# Patient Record
Sex: Female | Born: 1945 | Race: White | Hispanic: No | Marital: Single | Smoking: Former smoker
Health system: Southern US, Community
[De-identification: ages and names within clinical notes are randomized; demographics above are authoritative.]

## PROBLEM LIST (undated history)

## (undated) DIAGNOSIS — I639 Cerebral infarction, unspecified: Secondary | ICD-10-CM

## (undated) DIAGNOSIS — I10 Essential (primary) hypertension: Secondary | ICD-10-CM

## (undated) HISTORY — DX: Essential (primary) hypertension: I10

## (undated) HISTORY — DX: Cerebral infarction, unspecified: I63.9

---

## 2004-11-11 ENCOUNTER — Ambulatory Visit: Payer: Self-pay | Admitting: Internal Medicine

## 2006-02-22 ENCOUNTER — Ambulatory Visit: Payer: Self-pay | Admitting: Internal Medicine

## 2006-03-06 ENCOUNTER — Ambulatory Visit: Payer: Self-pay | Admitting: Internal Medicine

## 2006-08-27 ENCOUNTER — Ambulatory Visit: Payer: Self-pay | Admitting: Unknown Physician Specialty

## 2008-09-10 ENCOUNTER — Ambulatory Visit: Payer: Self-pay | Admitting: Family Medicine

## 2011-07-13 ENCOUNTER — Ambulatory Visit: Payer: Self-pay | Admitting: Internal Medicine

## 2011-07-25 ENCOUNTER — Ambulatory Visit: Payer: Self-pay | Admitting: Internal Medicine

## 2011-12-03 IMAGING — US ULTRASOUND RIGHT BREAST
1 series · 17 of 25 positions shown · non-contrast
Comparison: none

REASON FOR EXAM: AV RT SPICULATED FOCAL ASYMMETRY
COMMENTS:

PROCEDURE:     US  - US BREAST RIGHT  - July 25, 2011 [DATE]
RESULT:     TECHNIQUE: Digital diagnostic right mammograms were obtained.
FDA approved computer-aided detection (CAD) for mammography was utilized for
this study.

[Series 1: ultrasound right breast · 17 of 30 slices shown]
[im 1/30]
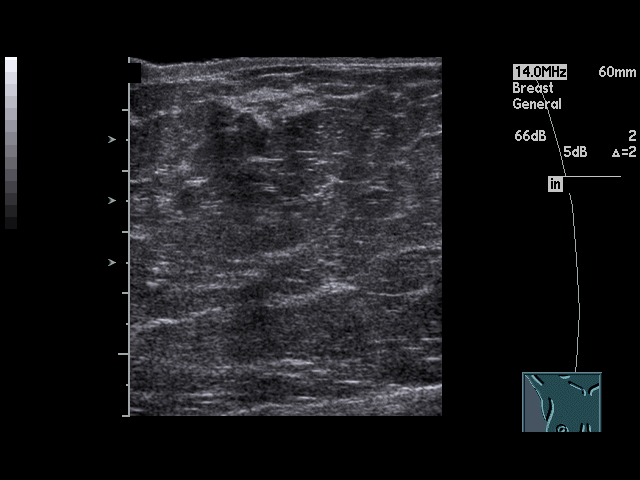
[im 3/30]
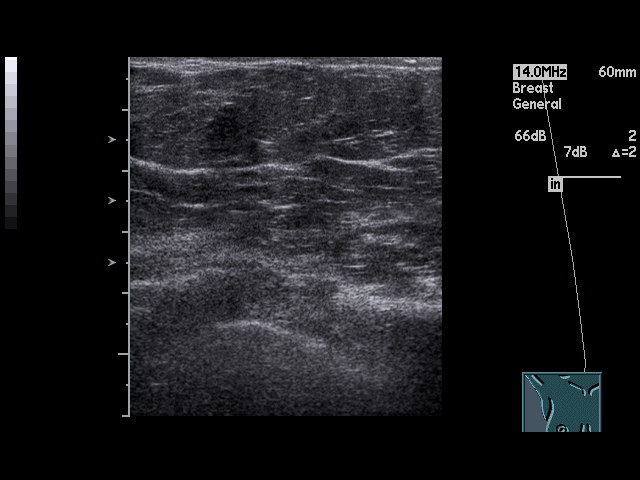
[im 4/30]
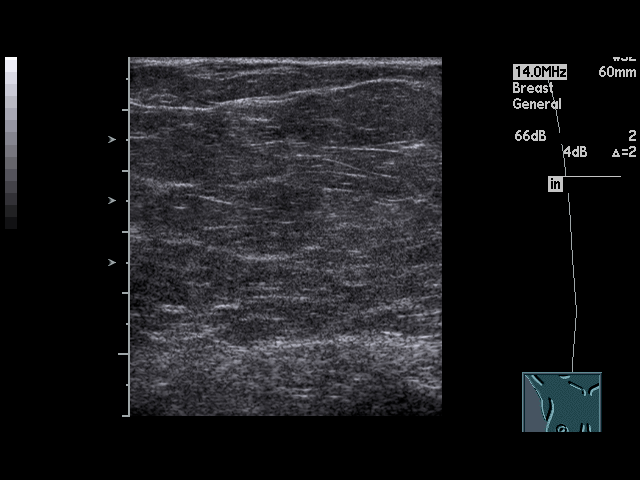
[im 7/30]
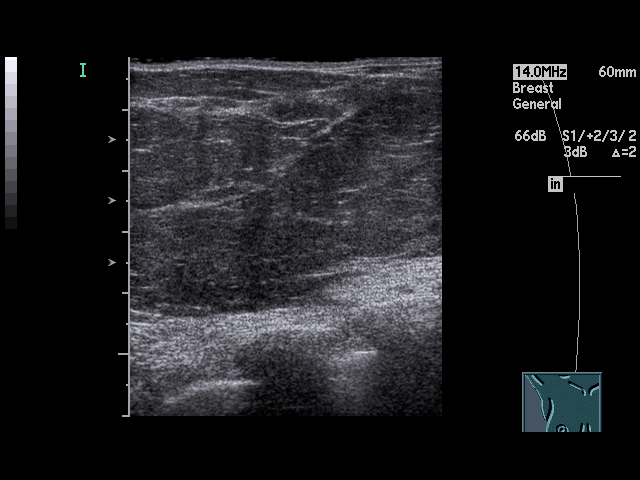
[im 8/30]
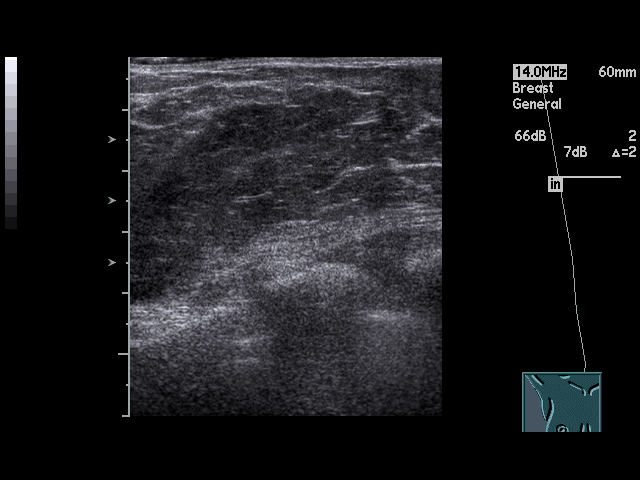
[im 10/30]
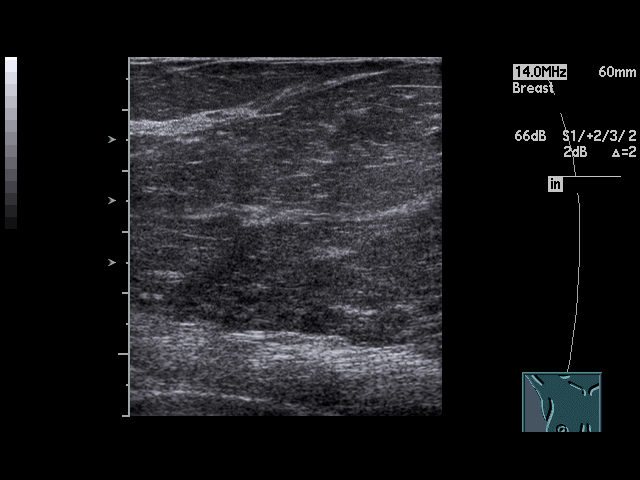
[im 11/30]
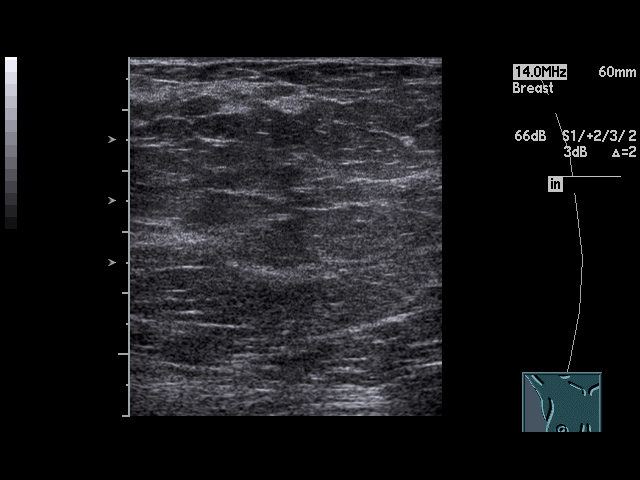
[im 14/30]
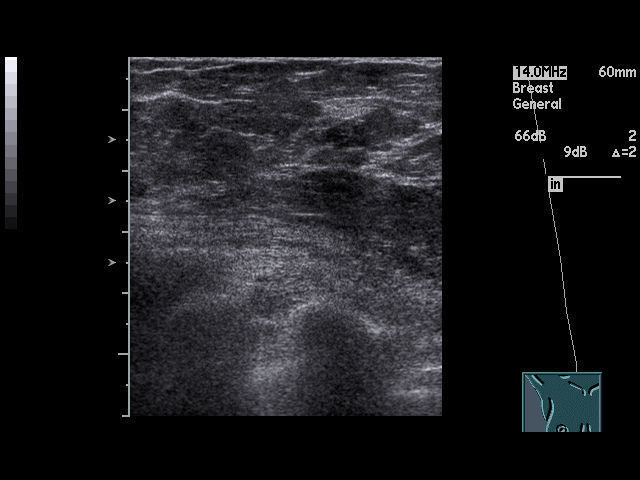
[im 15/30]
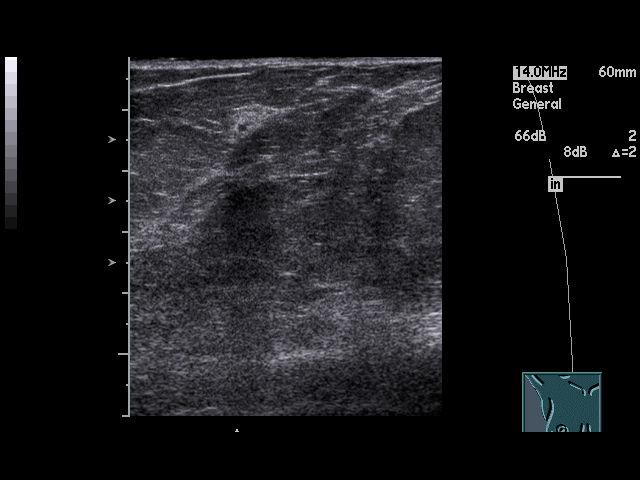
[im 16/30]
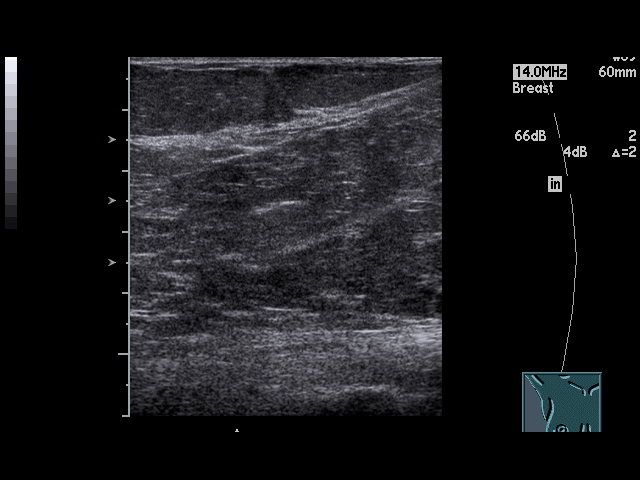
[im 19/30]
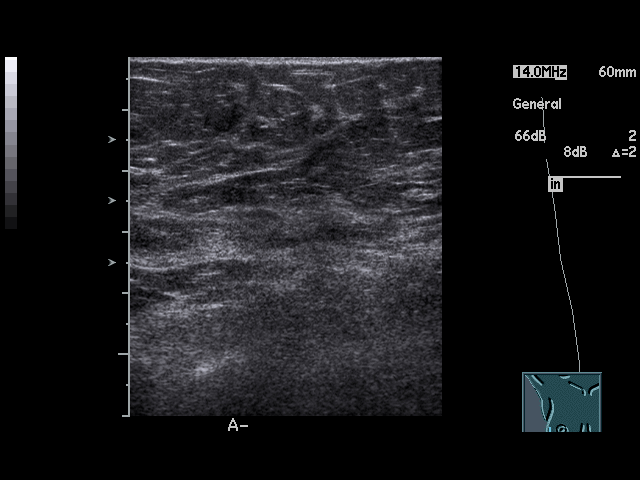
[im 20/30]
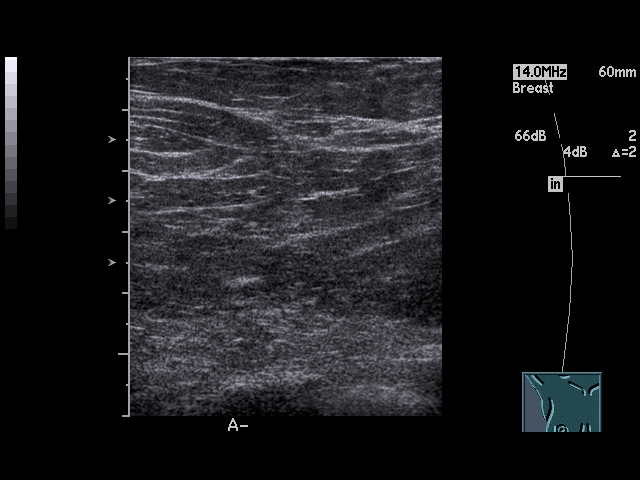
[im 22/30]
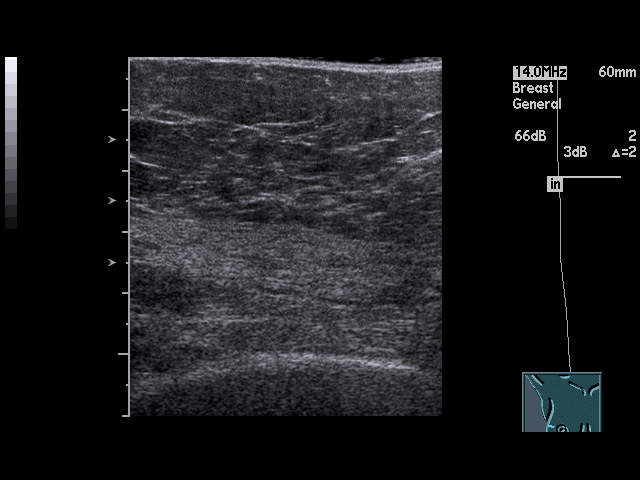
[im 23/30]
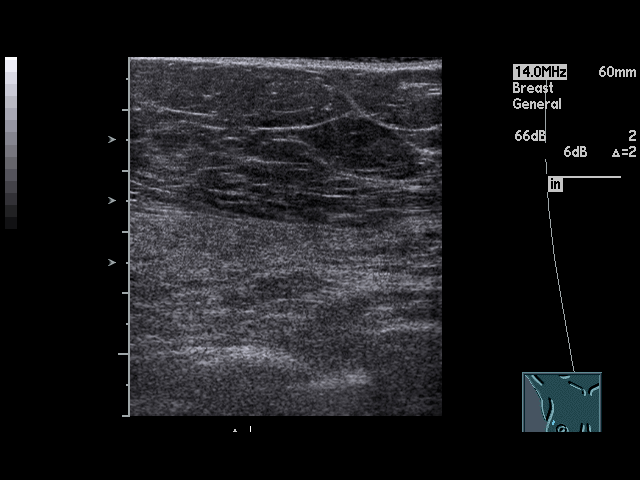
[im 26/30]
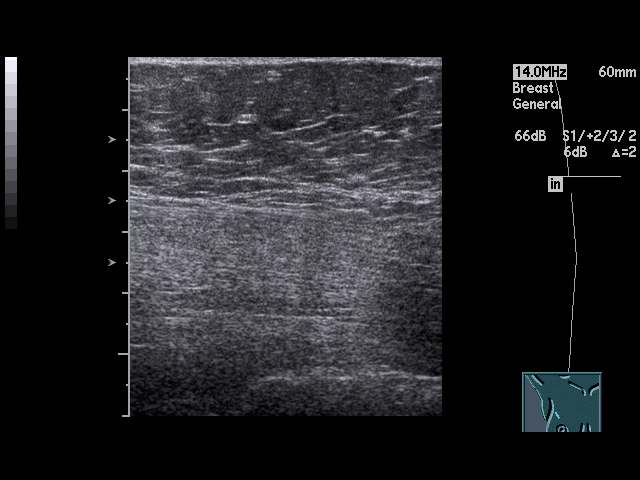
[im 27/30]
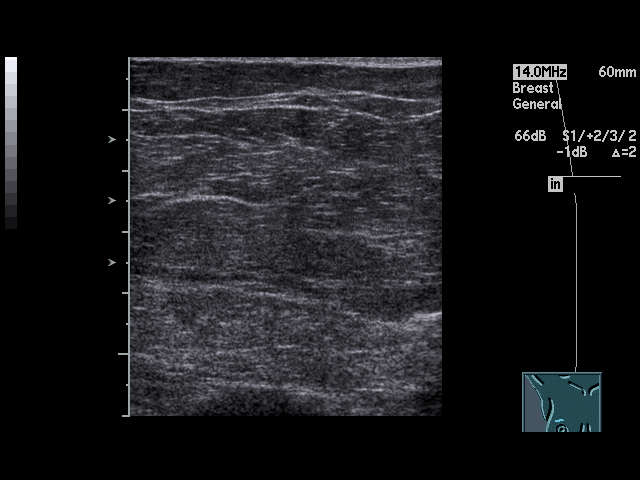
[im 30/30]
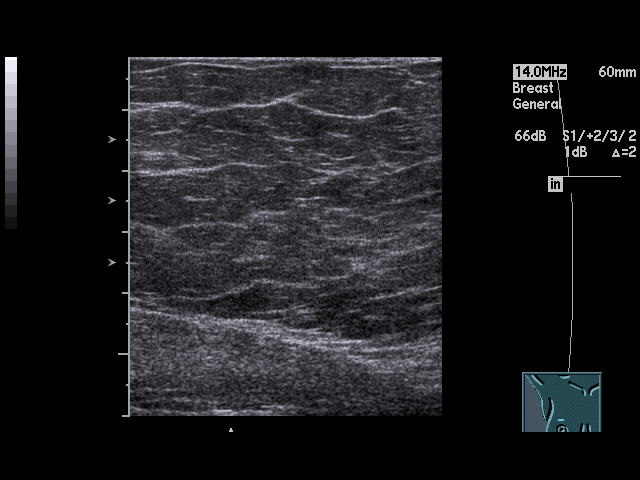

[17 of 25 positions shown; findings below may reference images not displayed]

FINDING: True lateral view and spot compression views of the right breast were
performed. There is an asymmetry in the inferior lateral posterior third of
the right breast which appears similar to multiple prior examinations dating
back to 11/11/2004.. There are no associated microcalcifications.

Further evaluation was performed with real-time sonography of the lateral
half of the right breast with a high frequency linear transducer. There is
no solid or cystic mass.
IMPRESSION: 1.     The previous demonstrated asymmetry in the inferior lateral posterior
third of the right breast is similar in appearance to the prior examination
of 11/11/2004. No further evaluation recommended.

BI-RADS:  Category 2 - Benign Findings

A negative mammogram report does not preclude biopsy or other evaluation of
a clinically palpable or otherwise suspicious mass or lesion. Breast cancer
may not be detected by mammography in up to 10% of cases.

## 2012-07-15 ENCOUNTER — Ambulatory Visit: Payer: Self-pay | Admitting: Internal Medicine

## 2012-07-17 ENCOUNTER — Ambulatory Visit: Payer: Self-pay | Admitting: Internal Medicine

## 2012-11-23 IMAGING — MG MM CAD SCREENING MAMMO
1 series · 4 of 4 positions shown · non-contrast
Comparison: none

REASON FOR EXAM: SCR MAMMO NO ORDER
COMMENTS:

[R CC · right · 4 of 4 slices shown]
[im 1/4]
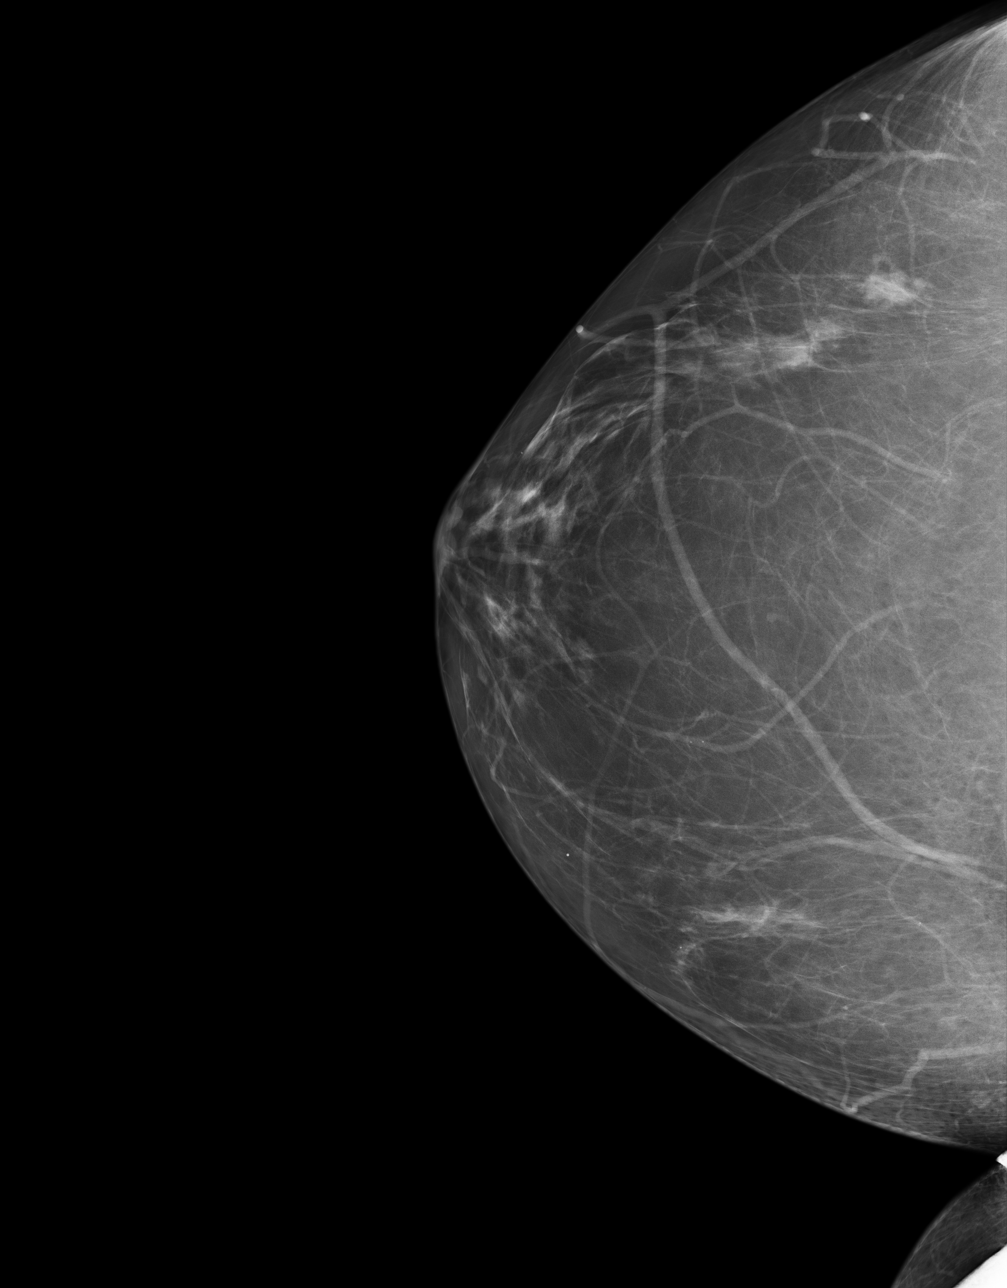
[im 2/4]
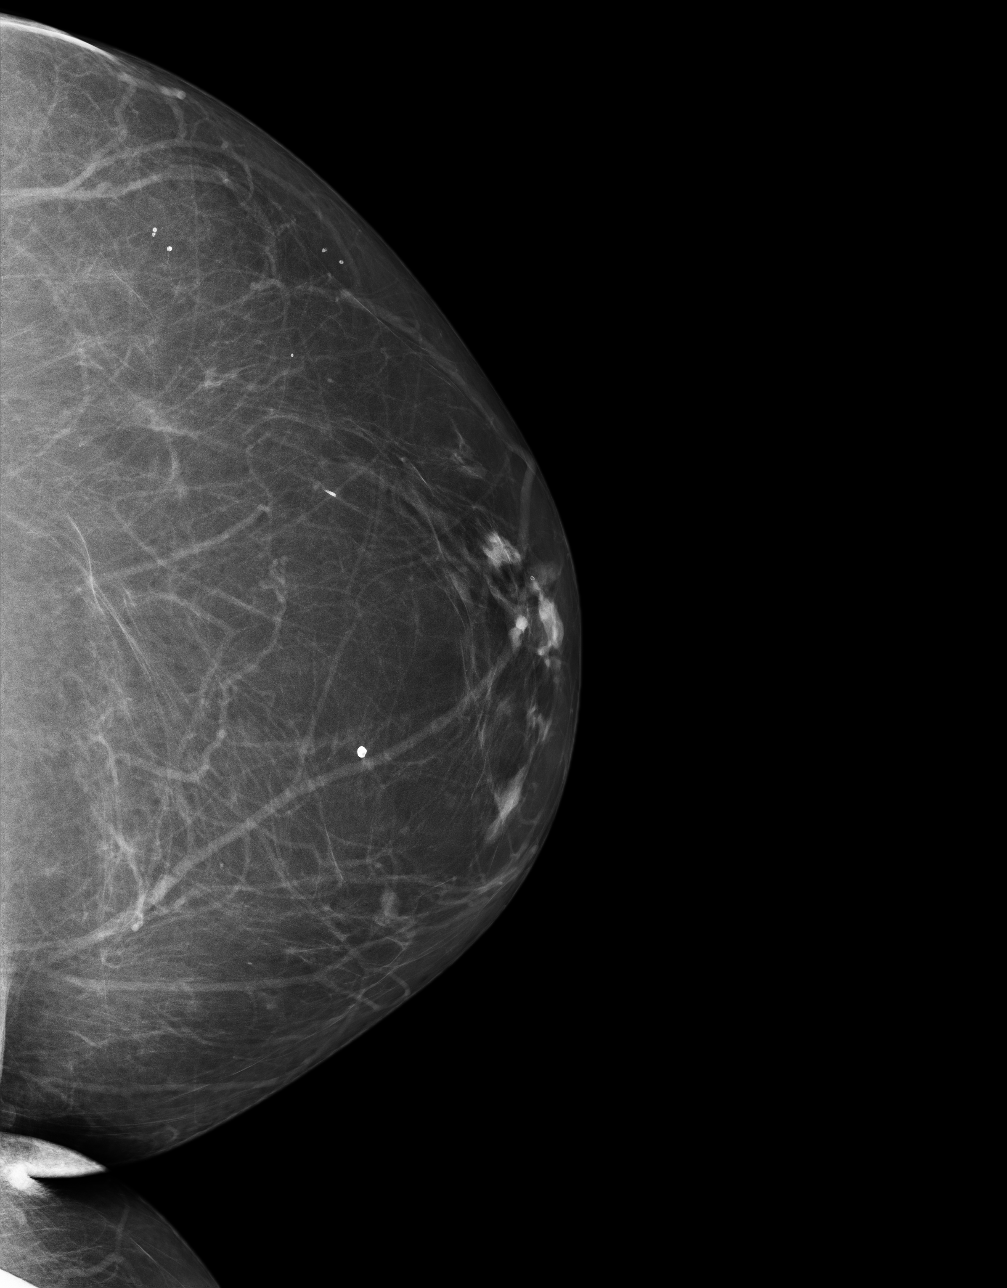
[im 3/4]
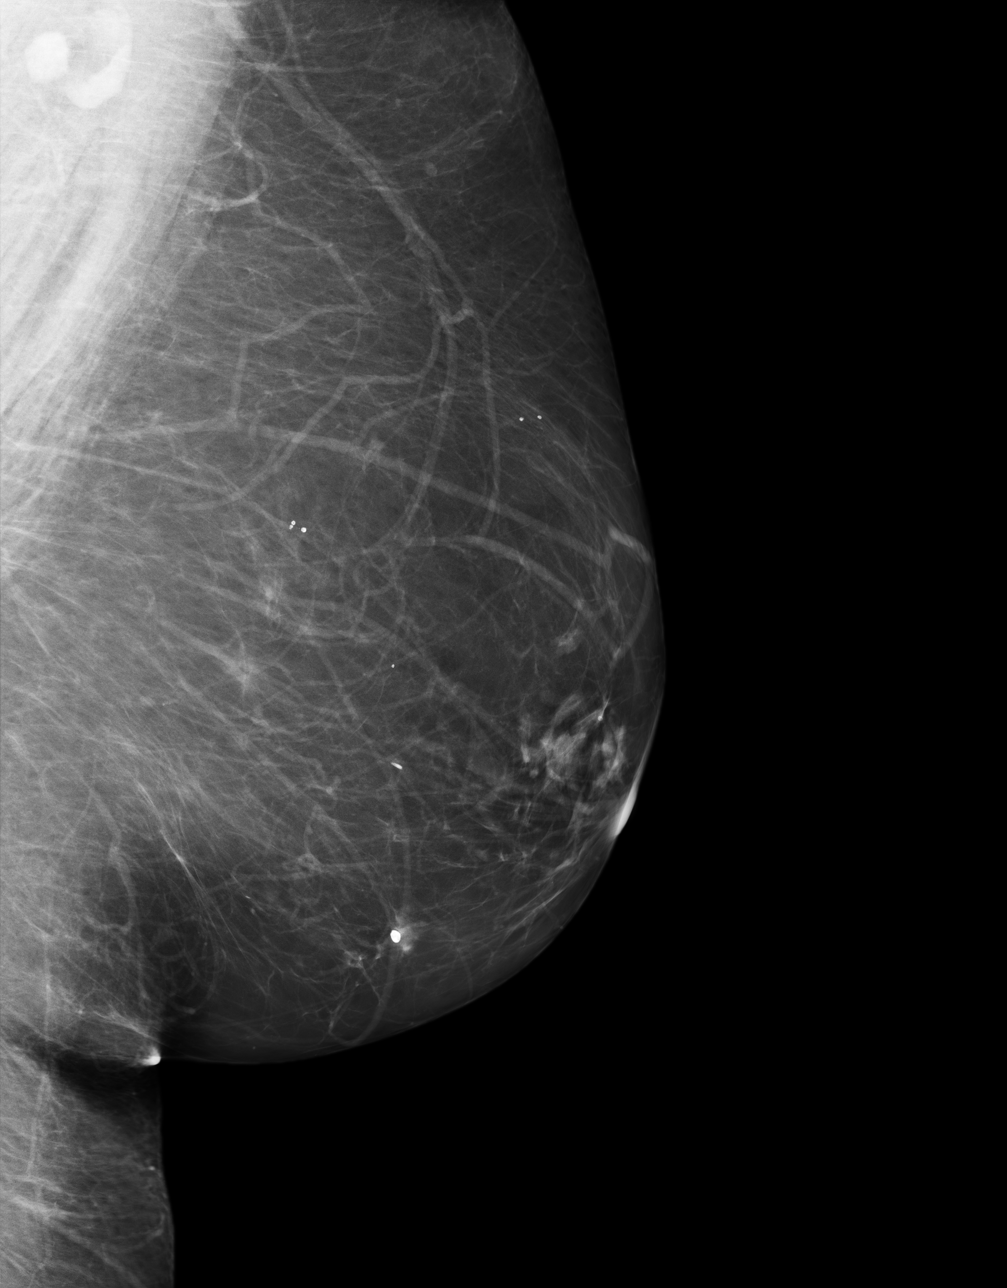
[im 4/4]
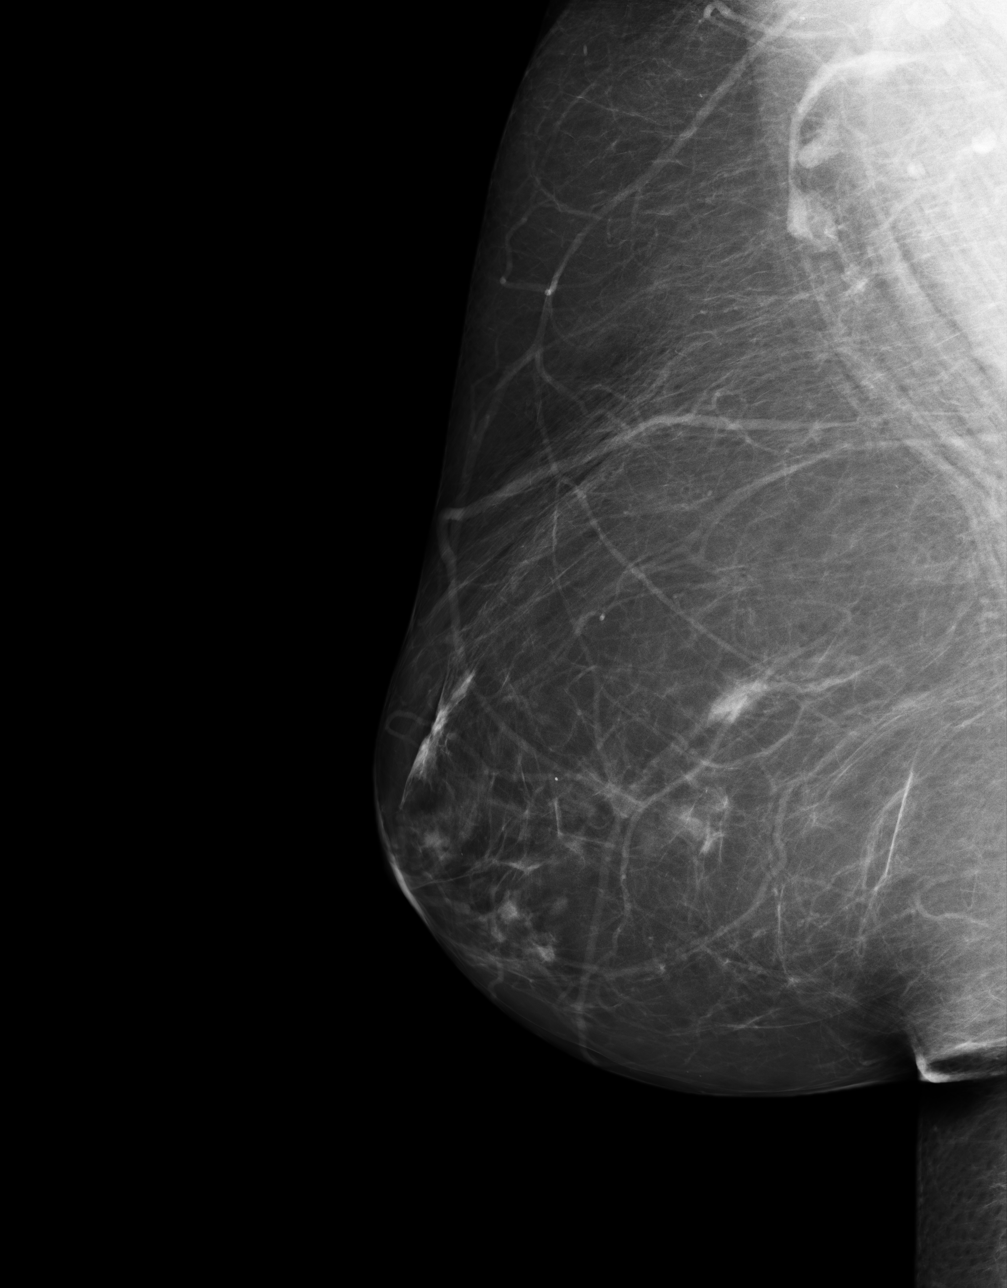

[4 of 4 positions shown; findings below may reference images not displayed]

PROCEDURE:     MAM - MAM DGTL SCRN MAM NO ORDER W/CAD  - July 15, 2012  [DATE]

RESULT:     The patient has history of previous breast reduction surgery.
Comparison is made to previous digital studies July 13, 2011,September 10, 2008, and February 22, 2006.

The breasts exhibit an involutional pattern. Benign-appearing lymph nodes
are present in the axillary regions. There are scattered coarse micro and
macrocalcifications on the left. There are no malignant appearing groupings
of microcalcification. There is parenchymal density laterally on the right
along the equator which has gradually become more conspicuous over the
preceding several years. This merits further evaluation with spot
compression views, a true lateral film, and possible ultrasound.
IMPRESSION: On the right there is progressive increase in conspicuity
of irregular nodularity laterally. The area has been marked electronically
and the images saved. Further evaluation with supplemental mammographic
views and possible ultrasound is recommended.

BI-RADS 0: Additional workup of the right breast is recommended.

A NEGATIVE MAMMOGRAM REPORT DOES NOT PRECLUDE BIOPSY OR OTHER EVALUATION OF
A CLINICALLY PALPABLE OR OTHERWISE SUSPICIOUS MASS OR LESION. BREAST CANCER
MAY NOT BE DETECTED BY MAMMOGRAPHY IN UP TO 10% OF CASES.

Dictation site:1

## 2012-11-25 IMAGING — MG MM ADDITIONAL VIEWS AT NO CHARGE
1 series · 5 of 5 positions shown · non-contrast
Comparison: none

REASON FOR EXAM: av rt nodularity
COMMENTS:

[R ML · right · 5 of 5 slices shown]
[im 1/5]
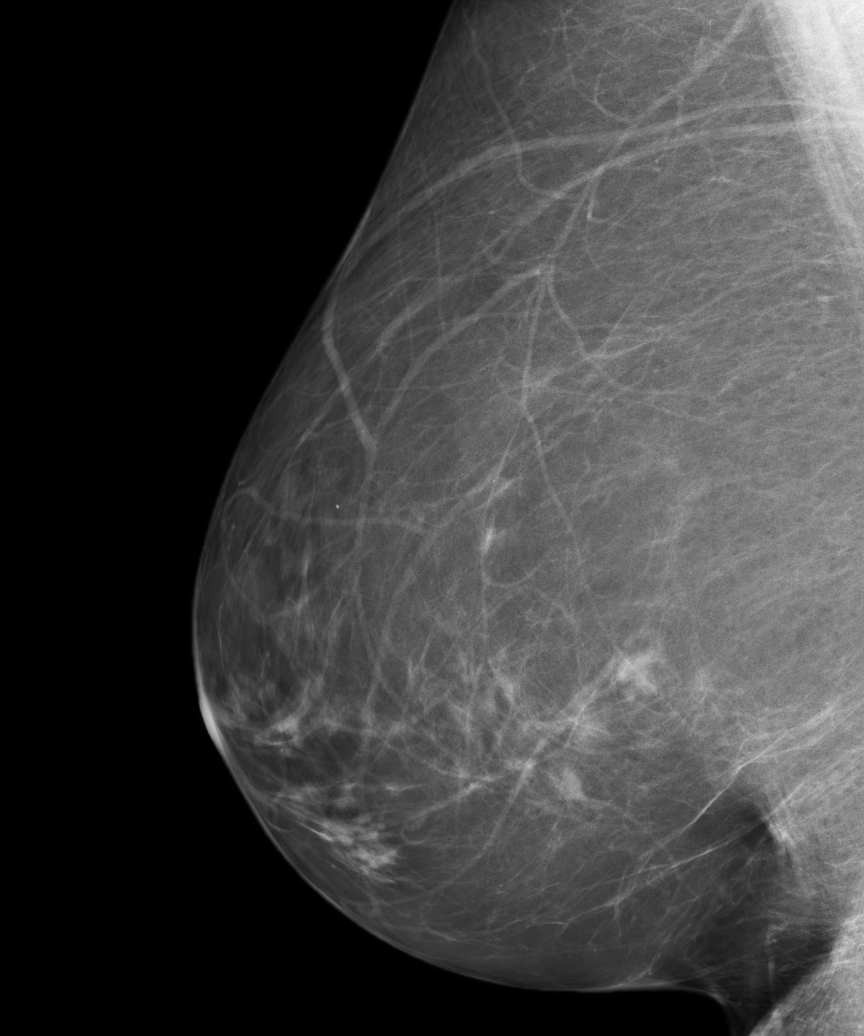
[im 2/5]
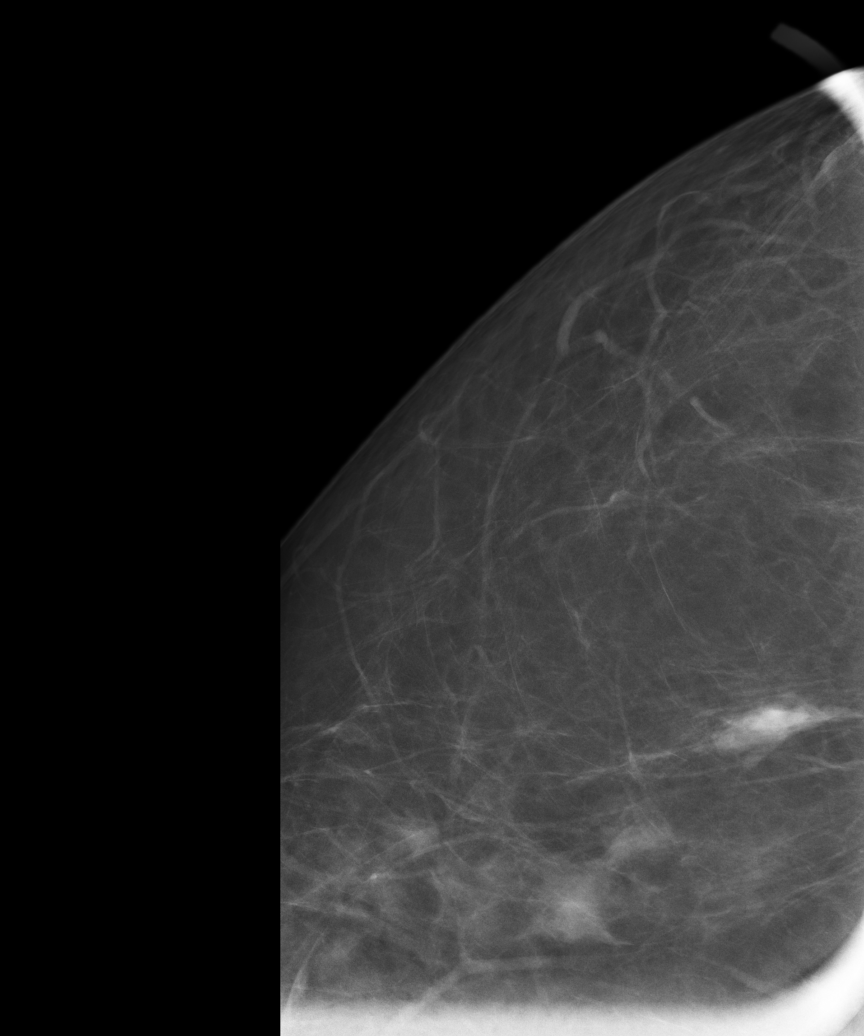
[im 3/5]
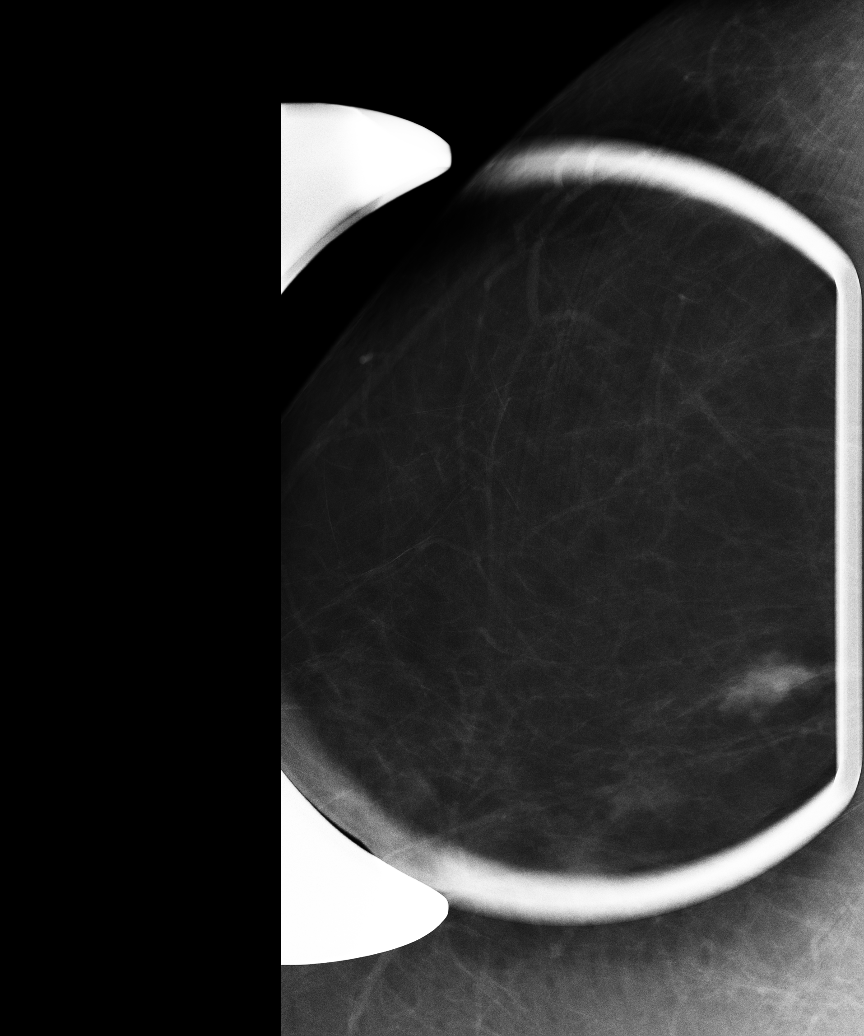
[im 4/5]
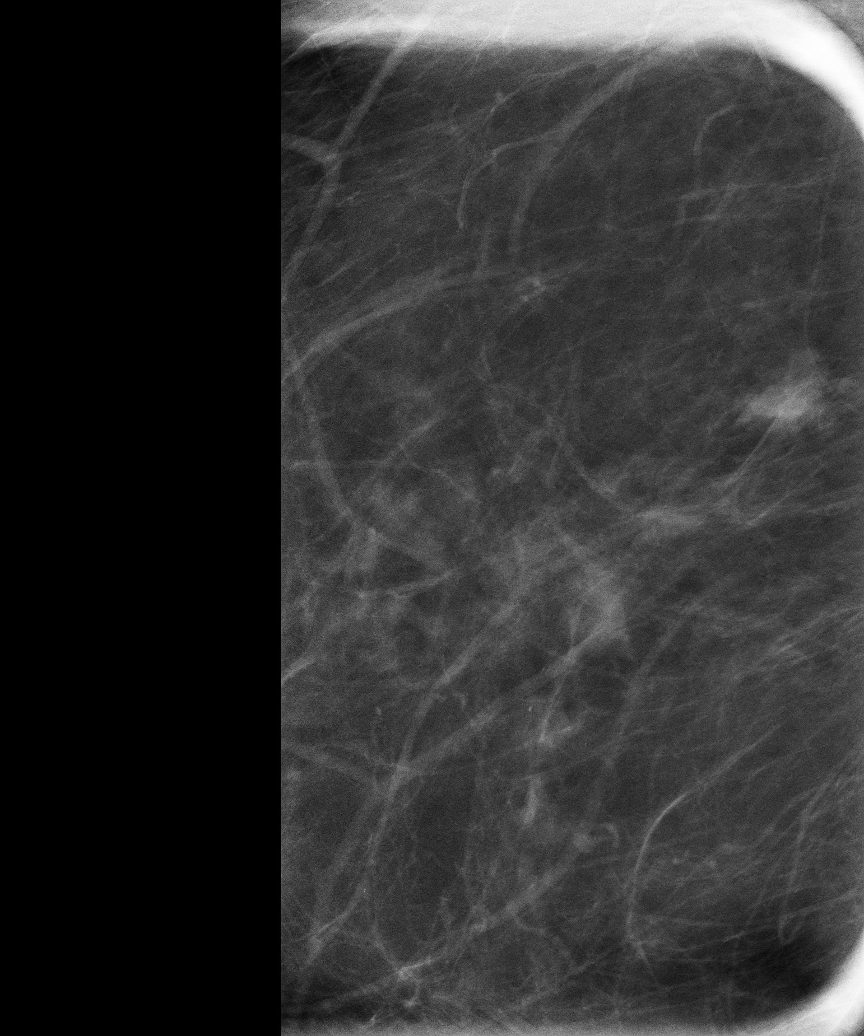
[im 5/5]
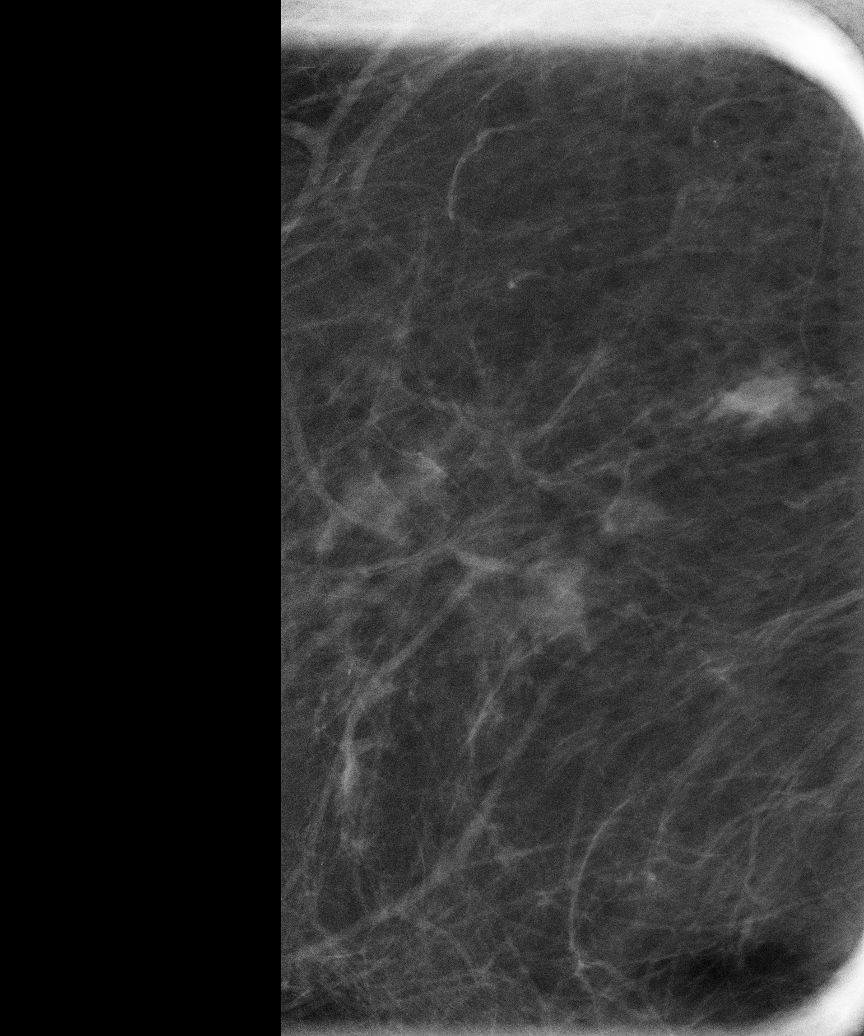

[5 of 5 positions shown; findings below may reference images not displayed]

PROCEDURE:     MAM - MAM DIG ADDVIEWS RT SCR  - July 17, 2012 [DATE]

RESULT:     The patient underwent screening mammography on 15 July, 2012.
At that time nodularity was demonstrated on the right. His leg laterally at
approximately the [DATE] to [DATE] position and had increased in conspicuity
since the previous study.

The patient returns for spot compression and true lateral films today.
Density persists in the area of concern at approximately the [DATE] to [DATE]
position. Ultrasound today revealed nonsimple appearing cystic structures
the distal shadowing in this region.
IMPRESSION: There new indeterminate findings in the lateral aspect of
the right breast.

BI-RADS 4A: Surgical consultation is recommended with an eye toward wire
localization or biopsy using ultrasonic guidance a very complex appearing
area the distal shadowing in the lateral aspect of the right breast. This
corresponds to the area of mammographic nodularity.

A NEGATIVE MAMMOGRAM REPORT DOES NOT PRECLUDE BIOPSY OR OTHER EVALUATION OF
A CLINICALLY PALPABLE OR OTHERWISE SUSPICIOUS MASS OR LESION. BREAST CANCER
MAY NOT BE DETECTED BY MAMMOGRAPHY IN UP TO 10% OF CASES.

Dictation site:1

## 2016-09-26 ENCOUNTER — Ambulatory Visit: Payer: Medicare Other | Attending: Geriatric Medicine | Admitting: Physical Therapy

## 2016-09-26 ENCOUNTER — Encounter: Payer: Self-pay | Admitting: Physical Therapy

## 2016-09-26 DIAGNOSIS — R2681 Unsteadiness on feet: Secondary | ICD-10-CM | POA: Diagnosis present

## 2016-09-26 DIAGNOSIS — R262 Difficulty in walking, not elsewhere classified: Secondary | ICD-10-CM

## 2016-09-26 DIAGNOSIS — M6281 Muscle weakness (generalized): Secondary | ICD-10-CM | POA: Insufficient documentation

## 2016-09-26 DIAGNOSIS — R278 Other lack of coordination: Secondary | ICD-10-CM | POA: Insufficient documentation

## 2016-09-26 DIAGNOSIS — R471 Dysarthria and anarthria: Secondary | ICD-10-CM | POA: Diagnosis present

## 2016-09-26 DIAGNOSIS — R488 Other symbolic dysfunctions: Secondary | ICD-10-CM | POA: Insufficient documentation

## 2016-09-26 DIAGNOSIS — R490 Dysphonia: Secondary | ICD-10-CM | POA: Diagnosis present

## 2016-09-26 NOTE — Therapy (Signed)
[facility] MAIN [facility] SERVICES [address] [place], [place], [address] Phone: [phone]   Fax:  [phone]  Physical Therapy Evaluation  Patient Details  Name: Angela Rowe MRN: [id number] Date of Birth: 27-Oct-1945 Referring Provider: Dr. Emelda Brothers  Encounter Date: 09/26/2016      PT End of Session - 09/26/16 1237    Visit Number 1   Number of Visits 9   Date for PT Re-Evaluation 10/24/16   Authorization Type gcode 1   Authorization Time Period 10   PT Start Time 1100   PT Stop Time 1150   PT Time Calculation (min) 50 min   Equipment Utilized During Treatment Gait belt   Activity Tolerance Patient tolerated treatment well   Behavior During Therapy [facility] for tasks assessed/performed      Past Medical History:  Diagnosis Date  . Hypertension    somewhat controlled; taking meds;   . Stroke (HCC)    08/07/16    History reviewed. No pertinent surgical history.  There were no vitals filed for this visit.       Subjective Assessment - 09/26/16 1056    Subjective 71 yo Female s/p CVA on 08/07/16; Patient reports being in hospital for approximately 6 days and then was transferred to [facility] to her daughter's house. Patient did recieve in home care rehab. Patient reports being home in [place] for approximately 2 weeks; Patient lives alone and reports being independent in all self care ADLs; Patient reports doing okay with her walking but notices that her balance is impaired. She is not using any assistive devices at this time; She reports having tinging in BLE toes which is her baseline; Patient reports having increased back pain with prolonged standing/walking for about 1 year; she is scheduled for bone density scan tomorrow; she denies any other treatment for back pain;    Pertinent History personal factors affecting rehab: lives alone, HTN (somewhat controlled), increased risk for falls;    How long can you sit comfortably? NA   How  long can you stand comfortably? will have low back pain after standing for 10-15 min;    How long can you walk comfortably? 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 No/denies            [facility] PT Assessment - 09/26/16 0001      Assessment   Medical Diagnosis s/p CVA   Referring Provider Dr. Emelda Brothers   Onset Date/Surgical Date 08/07/16   Hand Dominance Right   Next MD Visit 2 months   Prior Therapy had home health PT with good results; denies any outpatient for this condition;      Precautions   Precautions Fall     Restrictions   Weight Bearing Restrictions No     Balance Screen   Has the patient fallen in the past 6 months No   Has the patient had a decrease in activity level because of a fear of falling?  No   Is the patient reluctant to leave their home because of a fear of falling?  No     Home Environment   Additional Comments lives in 2 story home; has 2nd flight to get to bedroom/bathroom; will negotiate steps one step at a time when descending but able to  alternate when ascending; ; lives alone;      Prior Function   Level of Independence Independent;Independent with gait;Independent with transfers   Vocation Retired   Leisure Scientist, physiological, listen to books, pretty sedentary due to back pain;      Cognition   Overall Cognitive Status Within Functional Limits for tasks assessed     Observation/Other Assessments   Observations patient is a very pleasant woman; good posture;      Sensation   Light Touch Appears Intact  does have tingling in toes which is baseline;    Proprioception Appears Intact     Coordination   Gross Motor Movements are Fluid and Coordinated Yes   Fine Motor Movements are Fluid and Coordinated Yes  reports having trouble writing;       Posture/Postural Control   Posture Comments demonstrates erect posture;      AROM   Overall AROM Comments BUE and BLE is [facility]     Strength   Overall Strength Comments BUE grossly 4+/5, BLE: hip 4/5, knee 4/5, ankle 4-/5     Palpation   Palpation comment denies any tenderness to palpation;      Transfers   Comments able to transfer sit<>Stand without pushing on chair;      Ambulation/Gait   Gait Comments ambulates with reciprocal gait pattern, slight foot drag occasionally, demonstrates increased lateral trunk sway with forward posture with prolonged ambulation due to hip discomfort;      6 Minute Walk- Baseline   BP (mmHg) 138/72   HR (bpm) 91   02 Sat (%RA) 97 %     6 Minute walk- Post Test   BP (mmHg) 157/73   HR (bpm) 122   02 Sat (%RA) 97 %     6 minute walk test results    Aerobic Endurance Distance Walked 1005   Endurance additional comments without AD with increased hip and knee discomfort and slight shortness of breath;      Standardized Balance Assessment   Five times sit to stand comments  14 sec without HHA (<15 sec indicates low fall risk)     Berg Balance Test   Sit to Stand Able to stand without using hands and stabilize independently   Standing Unsupported Able to stand safely 2 minutes   Sitting with Back Unsupported but Feet Supported on Floor or Stool Able to sit safely and securely 2 minutes   Stand to Sit Sits safely with minimal use of hands   Transfers Able to transfer safely, minor use of hands   Standing Unsupported with Eyes Closed Able to stand 10 seconds with supervision   Standing Ubsupported with Feet Together Able to place feet together independently and stand 1 minute safely   From Standing, Reach Forward with Outstretched Arm Can reach forward >12 cm safely (5")   From Standing Position, Pick up Object from Floor Able to pick up shoe, needs supervision   From Standing Position, Turn to Look Behind Over each Shoulder Looks behind one side  only/other side shows less weight shift   Turn 360 Degrees Able to turn 360 degrees safely in 4 seconds or less   Standing Unsupported, Alternately Place Feet on Step/Stool Able to stand independently and safely and complete 8 steps in 20 seconds   Standing Unsupported, One Foot in Front Needs help to step but can hold 15 seconds   Standing on One Leg Tries to lift leg/unable to hold 3 seconds but remains standing independently  Total Score 46   Berg comment: moderate (50%) risk for falls       TREATMENT: PT initiated HEP: Forward/backward walking 10 feet x3 laps unsupported; Tandem stance with 1-0 rail assist x10 sec hold x1 each LE in front; SLS on firm surface with 1-0 rail assist 10 sec hold x1 each;  Patient required min-moderate verbal/tactile cues for correct exercise technique including cues to hold onto counter as needed for balance;                     PT Education - 09/26/16 1237    Education provided Yes   Education Details HEP initiated, recommendations, plan of care;    Person(s) Educated Patient   Methods Explanation;Verbal cues   Comprehension Verbalized understanding;Returned demonstration;Verbal cues required             PT Long Term Goals - 09/26/16 1244      PT LONG TERM GOAL #1   Title Patient will be independent in home exercise program to improve strength/mobility for better functional independence with ADLs.   Time 4   Period Weeks   Status New     PT LONG TERM GOAL #2   Title Patient will increase Berg Balance score by > 6 points to demonstrate decreased fall risk during functional activities.   Time 4   Period Weeks   Status New     PT LONG TERM GOAL #3   Title  Patient will be independent with ascend/descend 12 steps using single UE in step over step pattern without LOB. Patient will be independent with ascend/descend 12 steps using single UE in step over step pattern without LOB for safety in home.    Time 4   Period Weeks    Status New     PT LONG TERM GOAL #4   Title Patient will increase BLE gross strength to 4+/5 as to improve functional strength for independent gait, increased standing tolerance and increased ADL ability.   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 Patient reports receiving home health PT, OT, Speech following stroke for about 2-3 weeks. She has since moved back home (she lives alone) and is interested in receiving outpatient PT to address weakness and impaired balance. Patient demonstrates fair strength in BLE but does have impaired endurance with decreased distance on 6 min walk test. Patient also demonstrates impaired balance with testing as mod Fall risk with Sharlene MottsBerg Balance assessment. She is unable to maintain SLS or tandem stance unsupported due to poor stance control. She is able to walk without AD independently with slightly slower gait speed. Patient would benefit from skilled PT intervention to improve balance/gait safety and LE strength to return to PLOF.    Clinical Impairments Affecting Rehab Potential positive: good PLOF, negative: fall risk, lives alone, co-morbidities; Patient's clinical presentation is stable as she has made improvements and is now mod I with self care ADLs;    PT Frequency 2x / week   PT Duration 4 weeks   PT Treatment/Interventions ADLs/Self Care Home Management;Cryotherapy;Moist Heat;Neuromuscular re-education;Balance training;Therapeutic exercise;Therapeutic activities;Functional mobility training;Stair training;Gait training;Patient/family education   PT Next Visit Plan advance HEP, strengthening/balance   PT Home Exercise Plan initiated, will advance next session;    Consulted and Agree with Plan of Care  Patient      Patient will benefit from skilled therapeutic intervention in order to improve the following deficits and impairments:   Decreased endurance, Decreased activity tolerance, Decreased strength, Pain, Difficulty walking, Decreased mobility, Decreased balance, Postural dysfunction, Decreased safety awareness  Visit Diagnosis: Unsteadiness on feet - Plan: PT plan of care cert/re-cert  Difficulty in walking, not elsewhere classified - Plan: PT plan of care cert/re-cert      G-Codes - 2016-10-03 1245    Functional Assessment Tool Used 5 times sit<>stand, 6 min walk, clinical judgement   Functional Limitation Mobility: Walking and moving around   Mobility: Walking and Moving Around Current Status (Z6109) At least 20 percent but less than 40 percent impaired, limited or restricted   Mobility: Walking and Moving Around Goal Status (U0454) At least 1 percent but less than 20 percent impaired, limited or restricted       Problem List There are no active problems to display for this patient.   Amiylah Anastos PT, DPT Oct 03, 2016, 12:47 PM  [facility] MAIN [facility] SERVICES [address] [place], [place], [address] Phone: [phone]   Fax:  [phone]  Name: Angela Rowe MRN: [id number] Date of Birth: 12-08-1945

## 2016-09-26 NOTE — Patient Instructions (Signed)
SIT TO STAND: No Device   Sit with feet shoulder-width apart, on floor.(Make sure that you are in a chair that won't move like a chair against a wall or couch etc) Lean chest forward, raise hips up from surface. Straighten hips and knees. Weight bear equally on left and right sides. 10___ reps per set, _2__ sets per day, _5__ days per week Place left leg closer to sitting surface.  Copyright  VHI. All rights reserved.  Backward Walking   Walk backward, toes of each foot coming down first. Take long, even strides. Make sure you have a clear pathway with no obstructions when you do this. Stand beside counter and walk backward  And then walk forward doing opposite directions; repeat 10 laps 2x a day at least 5 days a week.  Copyright  VHI. All rights reserved.  Tandem Walking   Stand beside kitchen sink and place one foot in front of the other, lift your hand and try to hold position for 10 sec. Repeat with other foot in front; Repeat 5 reps with each foot in front 5 days a week.Balance: Unilateral   Attempt to balance on left leg, eyes open. Hold _5-10___ seconds.Start with holding onto counter and if you get your balance you can try to let go of counter. Repeat __5__ times per set. Do __1__ sets per session. Do __1__ sessions per day. Keep eyes open:   http://orth.exer.us/29   Copyright  VHI. All rights reserved.     

## 2016-09-28 ENCOUNTER — Ambulatory Visit: Payer: Medicare Other | Admitting: Speech Pathology

## 2016-09-28 ENCOUNTER — Ambulatory Visit: Payer: Medicare Other | Admitting: Physical Therapy

## 2016-09-28 ENCOUNTER — Ambulatory Visit: Payer: Medicare Other | Admitting: Occupational Therapy

## 2016-10-02 ENCOUNTER — Ambulatory Visit: Payer: Medicare Other | Admitting: Physical Therapy

## 2016-10-02 ENCOUNTER — Encounter: Payer: Self-pay | Admitting: Speech Pathology

## 2016-10-02 ENCOUNTER — Ambulatory Visit: Payer: Self-pay | Admitting: Physical Therapy

## 2016-10-02 ENCOUNTER — Encounter: Payer: Self-pay | Admitting: Physical Therapy

## 2016-10-02 ENCOUNTER — Ambulatory Visit: Payer: Medicare Other | Admitting: Speech Pathology

## 2016-10-02 DIAGNOSIS — R471 Dysarthria and anarthria: Secondary | ICD-10-CM

## 2016-10-02 DIAGNOSIS — R488 Other symbolic dysfunctions: Secondary | ICD-10-CM

## 2016-10-02 DIAGNOSIS — R490 Dysphonia: Secondary | ICD-10-CM

## 2016-10-02 DIAGNOSIS — R262 Difficulty in walking, not elsewhere classified: Secondary | ICD-10-CM

## 2016-10-02 DIAGNOSIS — R2681 Unsteadiness on feet: Secondary | ICD-10-CM

## 2016-10-02 NOTE — Therapy (Signed)
[facility] [address] [place], [place], [address] Phone: [phone]   Fax:  [phone]  Speech Language Pathology Evaluation  Patient Details  Name: Angela Beringlice E Rowe MRN: [id number] Date of Birth: 1945/10/16 Referring Provider: Lance BoschWARSHAW, GREGG A   Encounter Date: 10/02/2016      End of Session - 10/02/16 1054    Visit Number 1   Number of Visits 13   Date for SLP Re-Evaluation 11/17/16   SLP Start Time 0930   SLP Stop Time  1015   SLP Time Calculation (min) 45 min   Activity Tolerance Patient tolerated treatment well      Past Medical History:  Diagnosis Date  . Hypertension    somewhat controlled; taking meds;   . Stroke (HCC)    08/07/16    History reviewed. No pertinent surgical history.  There were no vitals filed for this visit.          SLP Evaluation [facility] - 10/02/16 1047      SLP Visit Information   SLP Received On 10/02/16   Referring Provider Javier DockerWARSHAW, GREGG A    Onset Date 08/07/2016   Medical Diagnosis Left thalamic CVA     Subjective   Subjective Patient reports that she "sounds different", especially in conversation, and that she had word finding difficulties.   Patient/Family Stated Goal Speak normally     Pain Assessment   Currently in Pain? No/denies     General Information   HPI The patient is a 71 year old woman, S/P left thalamic CVA 08/07/2016, presenting for speech evaluation secondary residual changes in speech production.  The patient reports she had home health speech therapy before returning to her home to live independently.     Prior Functional Status   Cognitive/Linguistic Baseline Within functional limits     Cognition   Overall Cognitive Status Within Functional Limits for tasks assessed     Auditory Comprehension   Overall Auditory Comprehension Appears within functional limits for tasks assessed     Reading Comprehension   Reading Status Within funtional  limits     Verbal Expression   Overall Verbal Expression Impaired   Other Verbal Expression Comments Self-reported word finding difficulties     Oral Motor/Sensory Function   Overall Oral Motor/Sensory Function Impaired   Labial ROM Reduced right   Labial Coordination Reduced   Lingual ROM Reduced right   Lingual Coordination Reduced   Facial ROM Reduced right   Velum Within Functional Limits   Overall Oral Motor/Sensory Function Mild dysarthria     Motor Speech   Overall Motor Speech Impaired   Respiration Impaired   Level of Impairment Conversation   Phonation Breathy;Low vocal intensity   Resonance Within functional limits   Articulation Impaired   Level of Impairment Conversation   Intelligibility Intelligible   Phonation Impaired                         SLP Education - 10/02/16 1054    Education provided Yes   Education Details review results of testing and recommended goals   Person(s) Educated Patient   Methods Explanation   Comprehension Verbalized understanding            SLP Long Term Goals - 10/02/16 1224      SLP LONG TERM GOAL #1   Title Pt will improve speech intelligibility in response to moderately complex cognitive linguistic tasks by correct phonetic  placement, controlling rate of speech, over-articulation, and increased loudness to achieve 80% intelligibility.   Time 6   Period Weeks   Status New     SLP LONG TERM GOAL #2   Title Pt will improve vocal quality in conversation by using good breath support and control to achieve 80% intelligibility with min effort.   Time 6   Period Weeks   Status New     SLP LONG TERM GOAL #3   Title Pt will complete high level word finding tasks with 80% accuracy.   Time 6   Period Weeks   Status New          Plan - 10-29-16 1056    Clinical Impression Statement (P)  At 8 weeks post left thalamic CVA this 71 year old woman is presenting with mild dysarthria, mild dysphonia, and mild  anomia.  The patient compensates for dysarthria well in slowed short responses.  However, in conversation, the patient demonstrates increased imprecise / distorted articulation and breathy vocal quality.  The patient is reporting difficulty with word finding, as well.  The patient will benefit from skilled speech therapy for restorative and compensatory treatment of dysarthria, dysphonia, and anomia.   Speech Therapy Frequency (P)  2x / week   Duration (P)  Other (comment)  6 weeks   Treatment/Interventions (P)  SLP instruction and feedback;Compensatory strategies;Patient/family education;Other (comment);Cueing hierarchy;Language facilitation  Voice therapy   Potential to Achieve Goals (P)  Good   Potential Considerations (P)  Ability to learn/carryover information;Cooperation/participation level;Medical prognosis;Pain level;Previous level of function;Severity of impairments;Family/community support   SLP Home Exercise Plan (P)  To be determined   Consulted and Agree with Plan of Care (P)  Patient      Patient will benefit from skilled therapeutic intervention in order to improve the following deficits and impairments:   Dysarthria and anarthria - Plan: SLP plan of care cert/re-cert  Anomia - Plan: SLP plan of care cert/re-cert  Dysphonia - Plan: SLP plan of care cert/re-cert      G-Codes - 10/29/16 February 14, 1228    Functional Assessment Tool Used clinical judgment, motor speech evaluation   Functional Limitations Motor speech   Motor Speech Current Status [phone]) At least 20 percent but less than 40 percent impaired, limited or restricted   Motor Speech Goal Status (U0454) At least 1 percent but less than 20 percent impaired, limited or restricted      Problem List There are no active problems to display for this patient.  Dollene Primrose, MS/CCC- SLP  Leandrew Koyanagi 2016/10/29, 12:32 PM  [facility] MAIN [facility] SERVICES [address]  [place], [place], [address] Phone: [phone]   Fax:  [phone]  Name: Angela Rowe MRN: [id number] Date of Birth: 1945-12-17

## 2016-10-02 NOTE — Therapy (Signed)
[facility] [address] [place], [place], [address] Phone: [phone]   Fax:  [phone]  Physical Therapy Treatment  Patient Details  Name: Angela Rowe E Baune MRN: [id number] Date of Birth: [birth year] Referring Provider: Dr. Emelda BrothersWarshaw  Encounter Date: 10/02/2016      PT End of Session - 10/02/16 0854    Visit Number 2   Number of Visits 9   Date for PT Re-Evaluation 10/24/16   Authorization Type gcode 2   Authorization Time Period 10   PT Start Time 0845   PT Stop Time 0930   PT Time Calculation (min) 45 min   Equipment Utilized During Treatment Gait belt   Activity Tolerance Patient tolerated treatment well   Behavior During Therapy [facility] for tasks assessed/performed      Past Medical History:  Diagnosis Date  . Hypertension    somewhat controlled; taking meds;   . Stroke (HCC)    08/07/16    History reviewed. No pertinent surgical history.  There were no vitals filed for this visit.      Subjective Assessment - 10/02/16 0853    Subjective Patient reports feeling sick last week and weekend. She reports feeling better now. She denies any pain currently;    Pertinent History personal factors affecting rehab: lives alone, HTN (somewhat controlled), increased risk for falls;    How long can you sit comfortably? NA   How long can you stand comfortably? will have low back pain after standing for 10-15 min;    How long can you walk comfortably? 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 No/denies          TREATMENT:  Warm up on Nustep level 2 x4 min (unbilled);  Leg press, BLE plate 69#90# [id number] with mod Vcs to slow down eccentric return; required min Vcs for positioning for better strengthening;   Standing on  airex: Heel/toe raises x15 with 2-0 rail assist and min VCs to avoid excessive trunk movement and isolate ankle ROM; Alternate march x10 with 1 rail assist; Modified tandem stance with BUE ball pass side/side x10 each foot in front;  Standing on upside down 1/2 bolster: Heel/toe raises x15 with 2 rail assist with min VCs to avoid excessive trunk movement and isolate ankle ROM; BUE wand flexion with bolster in the middle x10 with min A for balance;   Standing with red tband around both legs: Hip abduction x10 bilaterally with min Vcs to avoid hip ER for better hip abductor strengthening; Hip extension x10 bilaterally with min VCs to avoid knee flexion for better hip strengthening; Side stepping 10 feet x3 laps each without rail assist for better balance challenge;   Seated: Red tband around both legs: Ankle DF 2x10 bilaterally for better ankle strengthening;  Sit<>Stand from regular seat with BUE yellow ball overhead lift (2#) x10;  Hoist hamstring curl BLE 30# 2x10 with min Vcs to avoid lumbar extension for better hip strengthening and to reduce discomfort;                    PT Education - 10/02/16 0854    Education provided Yes   Education Details HEP reinforced; strengthening/balance;    Person(s) Educated Patient   Methods Explanation;Verbal cues   Comprehension  Verbalized understanding;Returned demonstration;Verbal cues required             PT Long Term Goals - 09/26/16 1244      PT LONG TERM GOAL #1   Title Patient will be independent in home exercise program to improve strength/mobility for better functional independence with ADLs.   Time 4   Period Weeks   Status New     PT LONG TERM GOAL #2   Title Patient will increase Berg Balance score by > 6 points to demonstrate decreased fall risk during functional activities.   Time 4   Period Weeks   Status New     PT LONG TERM GOAL #3   Title  Patient will be independent with ascend/descend 12 steps  using single UE in step over step pattern without LOB. Patient will be independent with ascend/descend 12 steps using single UE in step over step pattern without LOB for safety in home.    Time 4   Period Weeks   Status New     PT LONG TERM GOAL #4   Title Patient will increase BLE gross strength to 4+/5 as to improve functional strength for independent gait, increased standing tolerance and increased ADL ability.   Time 4   Period Weeks   Status New               Plan - 10/02/16 1610    Clinical Impression Statement Instructed patient in advanced strengthening and balance exercise. She required min Vcs for correct positioning and technique for optimum strengthening. Patient did require min A with advanced balance exercise. She denies any pain or soreness following treatment session; She would benefit from additional skilled PT intervetnion to improve balance/strength;    Clinical Impairments Affecting Rehab Potential positive: good PLOF, negative: fall risk, lives alone, co-morbidities; Patient's clinical presentation is stable as she has made improvements and is now mod I with self care ADLs;    PT Frequency 2x / week   PT Duration 4 weeks   PT Treatment/Interventions ADLs/Self Care Home Management;Cryotherapy;Moist Heat;Neuromuscular re-education;Balance training;Therapeutic exercise;Therapeutic activities;Functional mobility training;Stair training;Gait training;Patient/family education   PT Next Visit Plan advance HEP, strengthening/balance   PT Home Exercise Plan initiated, will advance next session;    Consulted and Agree with Plan of Care Patient      Patient will benefit from skilled therapeutic intervention in order to improve the following deficits and impairments:  Decreased endurance, Decreased activity tolerance, Decreased strength, Pain, Difficulty walking, Decreased mobility, Decreased balance, Postural dysfunction, Decreased safety awareness  Visit  Diagnosis: Unsteadiness on feet  Difficulty in walking, not elsewhere classified     Problem List There are no active problems to display for this patient.   Trotter,Margaret PT, DPT 10/02/2016, 9:32 AM  [facility] MAIN [facility] SERVICES [address] [place], [place], [address] Phone: [phone]   Fax:  [phone]  Name: TATE JERKINS MRN: [id number] Date of Birth: [birth year]

## 2016-10-04 ENCOUNTER — Encounter: Payer: Self-pay | Admitting: Occupational Therapy

## 2016-10-04 ENCOUNTER — Encounter: Payer: Self-pay | Admitting: Physical Therapy

## 2016-10-04 ENCOUNTER — Ambulatory Visit: Payer: Medicare Other | Admitting: Occupational Therapy

## 2016-10-04 ENCOUNTER — Ambulatory Visit: Payer: Medicare Other | Admitting: Physical Therapy

## 2016-10-04 ENCOUNTER — Ambulatory Visit: Payer: Medicare Other | Admitting: Speech Pathology

## 2016-10-04 ENCOUNTER — Ambulatory Visit: Payer: Self-pay | Admitting: Physical Therapy

## 2016-10-04 ENCOUNTER — Encounter: Payer: Self-pay | Admitting: Speech Pathology

## 2016-10-04 DIAGNOSIS — R2681 Unsteadiness on feet: Secondary | ICD-10-CM

## 2016-10-04 DIAGNOSIS — R488 Other symbolic dysfunctions: Secondary | ICD-10-CM

## 2016-10-04 DIAGNOSIS — R471 Dysarthria and anarthria: Secondary | ICD-10-CM

## 2016-10-04 DIAGNOSIS — R262 Difficulty in walking, not elsewhere classified: Secondary | ICD-10-CM

## 2016-10-04 DIAGNOSIS — R278 Other lack of coordination: Secondary | ICD-10-CM

## 2016-10-04 DIAGNOSIS — M6281 Muscle weakness (generalized): Secondary | ICD-10-CM

## 2016-10-04 DIAGNOSIS — R490 Dysphonia: Secondary | ICD-10-CM

## 2016-10-04 NOTE — Patient Instructions (Signed)
OT treatment  Neuromuscular re-ed:  Pt. performed [facility] tasks using the Grooved pegboard. Pt. worked on grasping the grooved pegs from a horizontal position, and moving the pegs to a vertical position in the hand to prepare for placing them in the grooved slot. Reviewed HEP for [facility] skills.Pt. Was given a visual handout.

## 2016-10-04 NOTE — Therapy (Signed)
[facility] [address] [place], [place], [address] Phone: [phone]   Fax:  [phone]  Physical Therapy Treatment  Patient Details  Name: Angela Rowe MRN: [id number] Date of Birth: 10/13/1945 Referring Provider: Dr. Emelda BrothersWarshaw  Encounter Date: 10/04/2016      PT End of Session - 10/04/16 0939    Visit Number 3   Number of Visits 9   Date for PT Re-Evaluation 10/24/16   Authorization Type gcode 3   Authorization Time Period 10   PT Start Time 0930   PT Stop Time 1000   PT Time Calculation (min) 30 min   Equipment Utilized During Treatment Gait belt   Activity Tolerance Patient tolerated treatment well   Behavior During Therapy [facility] for tasks assessed/performed      Past Medical History:  Diagnosis Date  . Hypertension    somewhat controlled; taking meds;   . Stroke (HCC)    08/07/16    History reviewed. No pertinent surgical history.  There were no vitals filed for this visit.      Subjective Assessment - 10/04/16 0936    Subjective Patient reports doing well today; no new changes; She reports having trouble going to [place] so she [facility]; Reports compliance with HEP;    Pertinent History personal factors affecting rehab: lives alone, HTN (somewhat controlled), increased risk for falls;    How long can you sit comfortably? NA   How long can you stand comfortably? will have low back pain after standing for 10-15 min;    How long can you walk comfortably? 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 No/denies         TREATMENT:  Warm up on Nustep level 2 x4 min (unbilled);  Leg press, BLE plate 69#90# [id number] with mod Vcs to slow down eccentric return;  required min Vcs for positioning for better strengthening;   Standing with green tband around both legs: Hip abduction x10 bilaterally with min Vcs to avoid hip ER for better hip abductor strengthening; Hip extension x10 bilaterally with min VCs to avoid knee flexion for better hip strengthening; Side stepping 10 feet x3 laps each without rail assist for better balance challenge;  Seated: green tband around both legs: Ankle DF x10 bilaterally for better ankle strengthening; Hip flexion x10 bilaterally; Hip abduction x10 bilaterally;   Resisted walking, 12.5# forward/backward, side/side x4 way x2 laps each direction;   Patient required min VCS and min A for balance with cues to slow down eccentric return for better strengthening;                     PT Education - 10/04/16 0939    Education provided Yes   Education Details HEP advanced, strengthening;    Person(s) Educated Patient   Methods Explanation;Verbal cues   Comprehension Verbalized understanding;Returned demonstration;Verbal cues required             PT Long Term Goals - 09/26/16 1244      PT LONG TERM GOAL #1   Title Patient will be independent in home exercise program to improve strength/mobility for better functional independence with ADLs.   Time 4   Period Weeks  Status New     PT LONG TERM GOAL #2   Title Patient will increase Berg Balance score by > 6 points to demonstrate decreased fall risk during functional activities.   Time 4   Period Weeks   Status New     PT LONG TERM GOAL #3   Title  Patient will be independent with ascend/descend 12 steps using single UE in step over step pattern without LOB. Patient will be independent with ascend/descend 12 steps using single UE in step over step pattern without LOB for safety in home.    Time 4   Period Weeks   Status New     PT LONG TERM GOAL #4   Title Patient will increase BLE gross strength to 4+/5 as to improve functional strength  for independent gait, increased standing tolerance and increased ADL ability.   Time 4   Period Weeks   Status New               Plan - 10/04/16 0940    Clinical Impression Statement Patient instructed in advanced LE strengthening for HEP; Patient required min VCs to improve posture and to increase ROM for better strengthening; She responded well to cues with better exercise technique; Patient would benefit from additional skilled PT intervention to improve strength, balance and gait safety;    Clinical Impairments Affecting Rehab Potential positive: good PLOF, negative: fall risk, lives alone, co-morbidities; Patient's clinical presentation is stable as she has made improvements and is now mod I with self care ADLs;    PT Frequency 2x / week   PT Duration 4 weeks   PT Treatment/Interventions ADLs/Self Care Home Management;Cryotherapy;Moist Heat;Neuromuscular re-education;Balance training;Therapeutic exercise;Therapeutic activities;Functional mobility training;Stair training;Gait training;Patient/family education   PT Next Visit Plan advance HEP, strengthening/balance   PT Home Exercise Plan Advanced- see patient instructions;    Consulted and Agree with Plan of Care Patient      Patient will benefit from skilled therapeutic intervention in order to improve the following deficits and impairments:  Decreased endurance, Decreased activity tolerance, Decreased strength, Pain, Difficulty walking, Decreased mobility, Decreased balance, Postural dysfunction, Decreased safety awareness  Visit Diagnosis: Unsteadiness on feet  Difficulty in walking, not elsewhere classified     Problem List There are no active problems to display for this patient.   Briannah Lona PT, DPT 10/04/2016, 9:50 AM   [facility] MAIN [facility] SERVICES [address] [place], [place], [address] Phone: [phone]   Fax:  [phone]  Name: Angela Rowe MRN:  [id number] Date of Birth: 12-Sep-1945

## 2016-10-04 NOTE — Therapy (Signed)
[facility] [address] , [place], [address] Phone: [phone]   Fax:  [phone]  Occupational Therapy Evaluation  Patient Details  Name: Roselyn Beringlice E Eland MRN: [id number] Date of Birth: 1946-03-28 Referring Provider: Javier DockerGregg Warshaw  Encounter Date: 10/04/2016      OT End of Session - 10/04/16 1212    Visit Number 1   Number of Visits 24   Date for OT Re-Evaluation 12/27/16   Authorization Type Medicare G code 1 of 10   OT Start Time 1103   OT Stop Time 1158   OT Time Calculation (min) 55 min   Activity Tolerance Patient tolerated treatment well   Behavior During Therapy [facility] for tasks assessed/performed      Past Medical History:  Diagnosis Date  . Hypertension    somewhat controlled; taking meds;   . Stroke (HCC)    08/07/16    History reviewed. No pertinent surgical history.  There were no vitals filed for this visit.      Subjective Assessment - 10/04/16 1205    Subjective  Pt. [place].   Pertinent History Pt. is a 71 y.o. female who suffered a CVA on 08/07/2016 while [place]. Pt. stayed with her daughter for the first month, and recieved HHOT services. Pt. presents for outpatient OT services for [facility] training, and IADLs.   Patient Stated Goals To move to [place].   Currently in Pain? No/denies           [facility] OT Assessment - 10/04/16 1109      Assessment   Diagnosis CVA   Referring Provider Javier DockerGregg Warshaw   Onset Date 08/07/16   Prior Therapy Home Health OT, PT, and ST     Balance Screen   Has the patient fallen in the past 6 months No   Has the patient had a decrease in activity level because of a fear of falling?  No   Is the patient reluctant to leave their home because of a fear of falling?  No     Home  Environment   Family/patient expects to be discharged to: Private  residence   Living Arrangements Alone   Type of Home Other (Comment)   Home Access --  2 storey condo with bedroom upstairs   Home Layout Two level   Bathroom Shower/Tub Tub/Shower unit   Scientist, research (physical sciences)hower/tub characteristics Door   Bathroom Accessibility No   Designer, television/film setAdaptive equipment Long-handled shoe horn   Home Equipment Shower seat   Lives With Alone     Prior Function   Level of Independence Independent   Vocation Retired   Leisure Out to lunch with friends     ADL   Eating/Feeding Independent   Grooming Independent   Product managerUpper Body Bathing Independent   Lower Body Bathing Modified independent  long handled sponge   Lower Biomedical scientistBody Dressing Independent   Toilet Tranfer Independent   Toileting -  Tour managerHygiene Independent   Tub/Shower Transfer Independent   ADL comments MAM test Sum score: 61: with difficulty opening a childproof bottle, wiriting 3-4 lines legibly, carrying pitcher, and keying numbers in a phone. Pt. also has difficulty typing      IADL   Shopping Takes care of all shopping needs independently   Light Housekeeping Does personal laundry completely   Medication Management Is responsible for taking medication in correct dosages at  correct time   Financial Management Manages financial matters independently (budgets, writes checks, pays rent, bills goes to bank), collects and keeps track of income     Written Expression   Dominant Hand Right   Handwriting 50% legible  for smaller written text. pt. is able to write large legibly     Vision - History   Baseline Vision Wears glasses all the time     Activity Tolerance   Activity Tolerance Tolerate 30+ min activity without fatigue     Sensation   Light Touch Appears Intact   Proprioception Appears Intact     Coordination   Right 9 Hole Peg Test 29   Left 9 Hole Peg Test 23     Strength   Overall Strength Comments 4+/5 overall BUE strength     Hand Function   Right Hand Grip (lbs) 35   Right Hand Lateral Pinch 15 lbs   Right  Hand 3 Point Pinch 13 lbs   Left Hand Grip (lbs) 35   Left Hand Lateral Pinch 15 lbs   Left 3 point pinch 15 lbs      OT TREATMENT  Neuromuscular re-ed:  Pt. performed FMC tasks using the Grooved pegboard. Pt. worked on grasping the grooved pegs from a horizontal position, and moving the pegs to a vertical position in the hand to prepare for placing them in the grooved slot. Reviewed HEP for [facility] skills.Pt. Was given a visual handout.                         OT Long Term Goals - 10/04/16 1222      OT LONG TERM GOAL #1   Title Pt.  will improve coordination skills to be able to independently use a cellphone efficiently.   Baseline Pt. has difficulty   Time 12   Period Weeks   Status New     OT LONG TERM GOAL #2   Title Pt. will independently open jars.   Baseline pt. has difficulty   Time 12   Period Weeks   Status New     OT LONG TERM GOAL #3   Title Pt. will independently type  a 5 sentence paragraph with no errors.   Baseline Pt. has difficulty   Time 12   Period Weeks   Status New     OT LONG TERM GOAL #4   Title Pt. will write a 3 sentence paragraph, independently, efficiently wtih 100% legibility.   Time 12   Period Weeks   Status New               Plan - 10/04/16 1213    Clinical Impression Statement Pt. is a 71 y.o. female who had a CVA on 08/07/2016 while at on vacation with her family at [facility] Pt. presents with impaired dominant right hand coordination skills which hinder her ability to write legibly, or efficiently, type, dial or text on a cellphone, pick up a pitcher, and open jars. Pt. scored a 61 sum score on the MAM. Pt. could benefit from skilled OT services to work on improving right hand [facility] skills during ADLs, and IADLs.   Rehab Potential Good   Clinical Impairments Affecting Rehab Potential Postive indocators: Age, Family support; Negative Indicators: Multiple comorbidities.   OT Frequency 2x / week   OT Duration  12 weeks   OT Treatment/Interventions Self-care/ADL training;Patient/family education;Therapeutic activities;Therapeutic exercise;Manual Therapy   OT Home Exercise Plan Reviewed HEP with  pt. for [facility] skills.   Consulted and Agree with Plan of Care Patient      Patient will benefit from skilled therapeutic intervention in order to improve the following deficits and impairments:  Decreased coordination, Impaired UE functional use, Decreased cognition, Decreased strength  Visit Diagnosis: Other lack of coordination  Muscle weakness (generalized)      G-Codes - 05-Oct-2016 1226    Functional Assessment Tool Used Clinical judgement, Dynamometer, MMT, MAM   Functional Limitation Carrying, moving and handling objects   Carrying, Moving and Handling Objects Current Status [phone]) At least 20 percent but less than 40 percent impaired, limited or restricted   Carrying, Moving and Handling Objects Goal Status ([id number]) At least 1 percent but less than 20 percent impaired, limited or restricted      Problem List There are no active problems to display for this patient.   Olegario Messier, MS, OTR/L 05-Oct-2016, 12:31 PM  [facility] MAIN [facility] SERVICES [address] [place], [place], [address] Phone: [phone]   Fax:  [phone]  Name: KENLEIGH TOBACK MRN: [id number] Date of Birth: Jul 04, 1946

## 2016-10-04 NOTE — Therapy (Signed)
[place] [facility] MAIN [facility] SERVICES [address] [place], [place], [address] Phone: [phone]   Fax:  [phone]  Speech Language Pathology Treatment  Patient Details  Name: Angela Rowe MRN: [id number] Date of Birth: 1946-07-04 Referring Provider: Lance Bosch   Encounter Date: 10/04/2016      End of Session - 10/04/16 1123    Visit Number 2   Number of Visits 13   Date for SLP Re-Evaluation 11/17/16   SLP Start Time 1000   SLP Stop Time  1100   SLP Time Calculation (min) 60 min      Past Medical History:  Diagnosis Date  . Hypertension    somewhat controlled; taking meds;   . Stroke (HCC)    08/07/16    History reviewed. No pertinent surgical history.  There were no vitals filed for this visit.      Subjective Assessment - 10/04/16 1123    Subjective "You've given me a lot to practice!"   Currently in Pain? No/denies               ADULT SLP TREATMENT - 10/04/16 0001      General Information   Behavior/Cognition Alert;Cooperative;Pleasant mood     Treatment Provided   Treatment provided Cognitive-Linquistic     Pain Assessment   Pain Assessment No/denies pain     Cognitive-Linquistic Treatment   Treatment focused on Voice;Dysarthria;Aphasia   Skilled Treatment SPEECH: Patient able to correctly produce /s/ in words, phrases and sentences.  Much less distortion in conversational speech.  Patient given multi-syllabic words and short sentences to practice slowed, over-articulated speech.  VOICE: Patient given breath support exercises and sustained back vowels (with full/resonant voice). Patient given multi-syllabic words and short sentences to practice oral resonant voice and constant flow phonation.  WORD FINDING: No overt word finding deficits in conversational speech.     Assessment / Recommendations / Plan   Plan Continue with current plan of care     Progression Toward Goals   Progression toward goals  Progressing toward goals          SLP Education - 10/04/16 1123    Education provided Yes   Education Details Speak as slowly as you need to be understood.  The rate is within normal limits and is only a change for you- others will not notice.   Person(s) Educated Patient   Methods Explanation   Comprehension Verbalized understanding            SLP Long Term Goals - 10/02/16 1224      SLP LONG TERM GOAL #1   Title Pt will improve speech intelligibility in response to moderately complex cognitive linguistic tasks by correct phonetic placement, controlling rate of speech, over-articulation, and increased loudness to achieve 80% intelligibility.   Time 6   Period Weeks   Status New     SLP LONG TERM GOAL #2   Title Pt will improve vocal quality in conversation by using good breath support and control to achieve 80% intelligibility with min effort.   Time 6   Period Weeks   Status New     SLP LONG TERM GOAL #3   Title Pt will complete high level word finding tasks with 80% accuracy.   Time 6   Period Weeks   Status New          Plan - 10/04/16 1124    Clinical Impression Statement The patient's articulatory precision and word finding are  much better today.  She reports that she had difficulty being understood in a shop yesterday, presumably due to trying to speak too rapidly.  The patient is expressing more concern with her voice today, and this was our major focus.   Speech Therapy Frequency 2x / week   Duration Other (comment)  6 weeks   Treatment/Interventions Compensatory techniques;Functional tasks;Patient/family education;Compensatory strategies;SLP instruction and feedback;Other (comment)  Voice therapy   Potential to Achieve Goals Good   Potential Considerations Ability to learn/carryover information;Co-morbidities;Cooperation/participation level;Medical prognosis;Pain level;Previous level of function;Severity of impairments;Family/community support   SLP Home  Exercise Plan Breath support exercises, oral resonance exercises, speech agility exercises   Consulted and Agree with Plan of Care Patient      Patient will benefit from skilled therapeutic intervention in order to improve the following deficits and impairments:   Dysarthria and anarthria  Anomia  Dysphonia    Problem List There are no active problems to display for this patient.  Angela PrimroseSusan G Marcheta Horsey, MS/CCC- SLP  Angela Rowe, Angela 10/04/2016, 11:26 AM  [place] [facility] [address] [place], [place], [address] Phone: [phone]   Fax:  [phone]   Name: Angela Rowe MRN: [id number] Date of Birth: 1945-11-12

## 2016-10-04 NOTE — Patient Instructions (Addendum)
  ABDUCTION: Sitting - Exercise Ball: Resistance Band (Active)   Sit with feet flat. With band tied around both legs, Lift right leg slightly and, against resistance band, draw it out to side. Complete __2_ sets of __10_ repetitions. Perform _2__ sessions per day.  Copyright  VHI. All rights reserved.  FLEXION: Sitting - Resistance Band (Active)   Sit, both feet flat. Have band tied around both legs above knees, lift right knee toward ceiling.Repeat with other knee Complete _2__ sets of _10__ repetitions. Perform _2__ sessions per day.  http://gtsc.exer.us/21   Knee Extension: Resisted (Sitting)   With band looped around right ankle and under other foot, straighten leg with ankle loop. Keep other leg bent to increase resistance. Repeat _10___ times per set. Do __2__ sets per session. Do _2___ sessions per day.  http://orth.exer.us/691   Copyright  VHI. All rights reserved.   Copyright  VHI. All rights reserved.  FLEXION: Sitting - Resistance Band (Active)   Sit with right foot flat. Have band tied around both feet, bend ankle, bringing toes toward head. Complete __2_ sets of __10_ repetitions. Perform _2__ sessions per day.  Balance, Proprioception: Hip Abduction With Tubing   With tubing attached to both ankles, Standing holding onto counter, kick one leg out to side and then Return.  Repeat _10___ times  On each side.  Do ___2_ sessions per day.  http://cc.exer.us/20    Balance, Proprioception: Hip Extension With Tubing   With tubing tied around both legs, holding onto kitchen counter, swing leg back. Return. Repeat _10___ times . Do __2__ sessions per day.  http://cc.exer.us/19    Band Walk: Side Stepping   Tie band around legs, around ankles. Step _10__ feet to one side, then step back to start. Repeat _2-3__ feet per session. Note: Small towel between band and skin eases rubbing.  http://plyo.exer.us/76   Copyright  VHI. All rights reserved.

## 2016-10-09 ENCOUNTER — Ambulatory Visit: Payer: Self-pay | Admitting: Physical Therapy

## 2016-10-10 ENCOUNTER — Ambulatory Visit: Payer: Medicare Other | Admitting: Physical Therapy

## 2016-10-10 ENCOUNTER — Ambulatory Visit: Payer: Medicare Other | Admitting: Occupational Therapy

## 2016-10-10 ENCOUNTER — Ambulatory Visit: Payer: Medicare Other | Admitting: Speech Pathology

## 2016-10-11 ENCOUNTER — Ambulatory Visit: Payer: Self-pay | Admitting: Physical Therapy

## 2016-10-12 ENCOUNTER — Encounter: Payer: Self-pay | Admitting: Occupational Therapy

## 2016-10-12 ENCOUNTER — Ambulatory Visit: Payer: Medicare Other | Admitting: Speech Pathology

## 2016-10-12 ENCOUNTER — Ambulatory Visit: Payer: Medicare Other | Attending: Geriatric Medicine | Admitting: Occupational Therapy

## 2016-10-12 ENCOUNTER — Encounter: Payer: Self-pay | Admitting: Speech Pathology

## 2016-10-12 ENCOUNTER — Ambulatory Visit: Payer: Medicare Other | Admitting: Physical Therapy

## 2016-10-12 ENCOUNTER — Encounter: Payer: Self-pay | Admitting: Physical Therapy

## 2016-10-12 DIAGNOSIS — R490 Dysphonia: Secondary | ICD-10-CM | POA: Diagnosis present

## 2016-10-12 DIAGNOSIS — R471 Dysarthria and anarthria: Secondary | ICD-10-CM

## 2016-10-12 DIAGNOSIS — M6281 Muscle weakness (generalized): Secondary | ICD-10-CM

## 2016-10-12 DIAGNOSIS — R262 Difficulty in walking, not elsewhere classified: Secondary | ICD-10-CM

## 2016-10-12 DIAGNOSIS — R488 Other symbolic dysfunctions: Secondary | ICD-10-CM

## 2016-10-12 DIAGNOSIS — R278 Other lack of coordination: Secondary | ICD-10-CM | POA: Insufficient documentation

## 2016-10-12 DIAGNOSIS — R2681 Unsteadiness on feet: Secondary | ICD-10-CM | POA: Diagnosis present

## 2016-10-12 NOTE — Therapy (Signed)
[place] [facility] MAIN [facility] SERVICES [address] [place], [place], [address] Phone: [phone]   Fax:  [phone]  Physical Therapy Treatment  Patient Details  Name: Angela Rowe MRN: [id number] Date of Birth: 09/02/1946 Referring Provider: Dr. Emelda Brothers  Encounter Date: 10/12/2016      PT End of Session - 10/12/16 0811    Visit Number 4   Number of Visits 9   Date for PT Re-Evaluation 10/24/16   Authorization Type gcode 4   Authorization Time Period 10   PT Start Time 0805   PT Stop Time 0845   PT Time Calculation (min) 40 min   Equipment Utilized During Treatment Gait belt   Activity Tolerance Patient tolerated treatment well   Behavior During Therapy [facility] for tasks assessed/performed      Past Medical History:  Diagnosis Date  . Hypertension    somewhat controlled; taking meds;   . Stroke (HCC)    08/07/16    History reviewed. No pertinent surgical history.  There were no vitals filed for this visit.      Subjective Assessment - 10/12/16 0810    Subjective Patient reports being sick this week; She presents to therapy with increased swelling around eyes which is related to allergic reaction; Patient reports feeling a little better, but is tired this morning; She denies any pain; Hasn't been able to do any exercise due to being out of town and sickness;    Pertinent History personal factors affecting rehab: lives alone, HTN (somewhat controlled), increased risk for falls;    How long can you sit comfortably? NA   How long can you stand comfortably? will have low back pain after standing for 10-15 min;    How long can you walk comfortably? 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 No/denies        TREATMENT:  Warm up on Nustep  BUE/BLE level 2 x4 min (unbilled);  Leg press, BLE plate 69# [id number] with mod Vcs to slow down eccentric return; required min Vcs for positioning for better strengthening;  Leg press, BLE heel raises 90# 2x12 with min  VCs for correct positioning for better ankle strengthening;   Standing on upside down 1/2  Bolster: Heel/toe raises x15 with rail assist with mod VCs to avoid trunk movement but to isolate ankle ROM for better stretch; Feet apart, BUE wand flexion x10 reps with min A for safety and cues to improve ankle strategies for better stance control; Tandem stance 10 sec hold 2-0 rail assist x3 each foot in front with CGA for safety;  Standing on airex: Feet modified tandem: BUE ball pass side/side x5 each foot in front; Feet slightly apart: Balloon taps each UE x3-4 min with cues to improve upper trunk control and hip strategies for better balance in stance;   Standing with green tband around both legs: Hip abduction x10 bilaterally with min Vcs to avoid hip ER for better hip abductor strengthening; Hip extension x10 bilaterally with min VCs to avoid knee flexion for better hip strengthening; Side stepping 10 feet x3 laps each without rail assist for better balance challenge;  Seated: green tband around both legs: Ankle DF x15 bilaterally for better ankle strengthening;  Hamstring curl, HOIST, plate #5 [id number] with cues to slow  down eccentric return for better strengthening;  Patient tolerated session well without increase in pain;                           PT Education - 10/12/16 0810    Education provided Yes   Education Details HEP reinforced; strengthening/balance;    Person(s) Educated Patient   Methods Explanation;Verbal cues   Comprehension Verbalized understanding;Returned demonstration;Verbal cues required             PT Long Term Goals - 09/26/16 1244      PT LONG TERM GOAL #1   Title Patient will be independent in home exercise program  to improve strength/mobility for better functional independence with ADLs.   Time 4   Period Weeks   Status New     PT LONG TERM GOAL #2   Title Patient will increase Berg Balance score by > 6 points to demonstrate decreased fall risk during functional activities.   Time 4   Period Weeks   Status New     PT LONG TERM GOAL #3   Title  Patient will be independent with ascend/descend 12 steps using single UE in step over step pattern without LOB. Patient will be independent with ascend/descend 12 steps using single UE in step over step pattern without LOB for safety in home.    Time 4   Period Weeks   Status New     PT LONG TERM GOAL #4   Title Patient will increase BLE gross strength to 4+/5 as to improve functional strength for independent gait, increased standing tolerance and increased ADL ability.   Time 4   Period Weeks   Status New               Plan - 10/12/16 [id number]    Clinical Impression Statement Patient instructed in advanced LE strengthening and balance exercise; She required min VCs for correct exercise technique and to improve posture/trunk control for better stance ability; Patient requires CGA to min A with advanced  balance exercise to improve safety; She would benefit from additional skilled PT intervention to improve LE strength, balance and gait ability;    Clinical Impairments Affecting Rehab Potential positive: good PLOF, negative: fall risk, lives alone, co-morbidities; Patient's clinical presentation is stable as she has made improvements and is now mod I with self care ADLs;    PT Frequency 2x / week   PT Duration 4 weeks   PT Treatment/Interventions ADLs/Self Care Home Management;Cryotherapy;Moist Heat;Neuromuscular re-education;Balance training;Therapeutic exercise;Therapeutic activities;Functional mobility training;Stair training;Gait training;Patient/family education   PT Next Visit Plan advance HEP, strengthening/balance   PT Home Exercise Plan  initiated, will advance next session;    Consulted and Agree with Plan of Care Patient      Patient will benefit from skilled therapeutic intervention in order to improve the following deficits and impairments:  Decreased endurance, Decreased activity tolerance, Decreased strength, Pain, Difficulty walking, Decreased mobility, Decreased balance, Postural dysfunction, Decreased safety awareness  Visit Diagnosis: Unsteadiness on feet  Difficulty in walking, not elsewhere classified  Muscle weakness (generalized)     Problem List There are no active problems to display for this patient.   Trotter,Margaret PT, DPT 10/12/2016, 8:52 AM  [place] [facility] [address] [place], [place], [address] Phone: [phone]   Fax:  [phone]  Name: Roselyn Beringlice E Tzeng MRN: [id number] Date of Birth: Nov 06, 1945

## 2016-10-12 NOTE — Therapy (Signed)
[place] [facility] [facility] [address] [place], [place], [address] Phone: [phone]   Fax:  [phone]  Occupational Therapy Treatment  Patient Details  Name: Angela Beringlice E Rowe MRN: [id number] Date of Birth: Aug 15, 1946 Referring Provider: Javier DockerGregg Warshaw  Encounter Date: 10/12/2016      OT End of Session - 10/12/16 0910    Visit Number 2   Number of Visits 24   Date for OT Re-Evaluation 12/27/16   Authorization Type Medicare G code 2 of 10   OT Start Time 0848   OT Stop Time 0933   OT Time Calculation (min) 45 min   Activity Tolerance Patient tolerated treatment well   Behavior During Therapy [facility] for tasks assessed/performed      Past Medical History:  Diagnosis Date  . Hypertension    somewhat controlled; taking meds;   . Stroke (HCC)    08/07/16    History reviewed. No pertinent surgical history.  There were no vitals filed for this visit.      Subjective Assessment - 10/12/16 0909    Subjective  Patient reports her eyes are puffy and she may have some allergies, took a benydryl last night and its a bit better.  She is going out of town for the weekend for her grandson's birthday.    Pertinent History Pt. is a 71 y.o. female who suffered a CVA on 08/07/2016 while [place]. Pt. stayed with her daughter for the first month, and recieved HHOT services. Pt. presents for outpatient OT services for [facility] training, and IADLs.   Patient Stated Goals To move to [place].   Currently in Pain? No/denies   Multiple Pain Sites No                      OT Treatments/Exercises (OP) - 10/12/16 2034      ADLs   ADL Comments Patient was seen for typing drills, 1st trial:  13 WPM, 8 errors with adjusted WPM to 5.  2nd trial 18 WPM, 27 errors, adjusted WPM 9.  Patient tends to demo errors on right side of keyboard.  Instructed on website for practice at home with typing with feedback on errors and  WPM.      Fine Motor Coordination   Other Fine Motor Exercises Patient seen for fine motor coordination exercises with knotting and unknotting medium nylon rope with cues for prehension patterns.  Manipulation of 100 pegboard for flipping and turning pieces with cues for isolated finger movements.      Neurological Re-education Exercises   Other Exercises 1 Resistive green theraputty exercises for gross grip, lateral pinch, 2 point and 3 point pinch skills with cues for technique.                 OT Education - 10/12/16 2037    Education provided Yes   Education Details [facility]MC exercises, typing drills   Person(s) Educated Patient   Methods Explanation;Demonstration;Verbal cues   Comprehension Verbal cues required;Returned demonstration;Verbalized understanding             OT Long Term Goals - 10/04/16 1222      OT LONG TERM GOAL #1   Title Pt.  will improve coordination skills to be able to independently use a cellphone efficiently.   Baseline Pt. has difficulty   Time 12   Period Weeks   Status New     OT LONG TERM GOAL #2  Title Pt. will independently open jars.   Baseline pt. has difficulty   Time 12   Period Weeks   Status New     OT LONG TERM GOAL #3   Title Pt. will independently type  a 5 sentence paragraph with no errors.   Baseline Pt. has difficulty   Time 12   Period Weeks     OT LONG TERM GOAL #4   Title Pt. will write a 3 sentence paragraph, independently, efficiently wtih 100% legibility.   Baseline Pt. has difficulty   Time 12   Period Weeks   Status New     OT LONG TERM GOAL #5   Title --   Time --   Period --   Status --               Plan - 10/12/16 0910    Clinical Impression Statement Patient demonstrates difficulty with typing skills, especially with texting messages and emails.  She was upset because she misspelled an item on facebook. Instructed patient on how to edit her posts if this happens again.  She demos  difficulty with right side of keyboard and would benefit from typing drills at home. Continue to work towards goals in POC to improve right hand use for daily tasks.    Rehab Potential Good   Clinical Impairments Affecting Rehab Potential Postive indocators: Age, Family support; Negative Indicators: Multiple comorbidities.   OT Frequency 2x / week   OT Duration 12 weeks   OT Treatment/Interventions Self-care/ADL training;Patient/family education;Therapeutic activities;Therapeutic exercise;Manual Therapy   Consulted and Agree with Plan of Care Patient      Patient will benefit from skilled therapeutic intervention in order to improve the following deficits and impairments:  Decreased coordination, Impaired UE functional use, Decreased cognition, Decreased strength  Visit Diagnosis: Other lack of coordination  Muscle weakness (generalized)    Problem List There are no active problems to display for this patient.  Kerrie Buffalo, OTR/L, CLT  Correll Denbow 10/12/2016, 8:41 PM  [place] [facility] MAIN [facility] SERVICES [address] [place], [place], [address] Phone: [phone]   Fax:  [phone]  Name: Angela Rowe MRN: [id number] Date of Birth: 18-Jul-1946

## 2016-10-12 NOTE — Therapy (Signed)
[place] [facility] [address] [place], [place], [address] Phone: [phone]   Fax:  [phone]  Speech Language Pathology Treatment  Patient Details  Name: Angela Rowe MRN: [id number] Date of Birth: 02-13-1946 Referring Provider: Javier DockerWARSHAW, GREGG A   Encounter Date: 10/12/2016      End of Session - 10/12/16 1101    Visit Number 3   Number of Visits 13   Date for SLP Re-Evaluation 11/17/16   SLP Start Time 1000   SLP Stop Time  1050   SLP Time Calculation (min) 50 min   Activity Tolerance Patient tolerated treatment well      Past Medical History:  Diagnosis Date  . Hypertension    somewhat controlled; taking meds;   . Stroke (HCC)    08/07/16    History reviewed. No pertinent surgical history.  There were no vitals filed for this visit.      Subjective Assessment - 10/12/16 1100    Subjective Patient reports that during conversation, she tends to "drop the little words"   Currently in Pain? No/denies               ADULT SLP TREATMENT - 10/12/16 0001      General Information   Behavior/Cognition Alert;Cooperative;Pleasant mood     Treatment Provided   Treatment provided Cognitive-Linquistic     Pain Assessment   Pain Assessment No/denies pain     Cognitive-Linquistic Treatment   Treatment focused on Voice;Dysarthria;Aphasia   Skilled Treatment SPEECH: Patient able to read aloud complex sentences and multi-syllabic words, maintaining accurate phonemic placement and organization.  Patient given tongue twisters for practice.  VOICE: Patient given breath support exercises and sustained back vowels (with full/resonant voice). Patient has had a cold and an allergic reaction with increased congestion. She is advised to reduce voice use some (not complete voice rest).   WORD FINDING: No overt word finding deficits in linguistic tasks or conversational speech.     Assessment / Recommendations / Plan   Plan  Continue with current plan of care     Progression Toward Goals   Progression toward goals Progressing toward goals          SLP Education - 10/12/16 1100    Education provided Yes   Education Details Speak as slowly as you need to be understood.  The rate is within normal limits and is only a change for you- others will not notice.   Person(s) Educated Patient   Methods Explanation   Comprehension Verbalized understanding            SLP Long Term Goals - 10/02/16 1224      SLP LONG TERM GOAL #1   Title Pt will improve speech intelligibility in response to moderately complex cognitive linguistic tasks by correct phonetic placement, controlling rate of speech, over-articulation, and increased loudness to achieve 80% intelligibility.   Time 6   Period Weeks   Status New     SLP LONG TERM GOAL #2   Title Pt will improve vocal quality in conversation by using good breath support and control to achieve 80% intelligibility with min effort.   Time 6   Period Weeks   Status New     SLP LONG TERM GOAL #3   Title Pt will complete high level word finding tasks with 80% accuracy.   Time 6   Period Weeks   Status New          Plan -  10/12/16 1101    Clinical Impression Statement The patient's articulatory precision /organization and word finding are much better today.  She reports that she tends to "drop the little words" in conversation.     Speech Therapy Frequency 2x / week   Duration Other (comment)   Treatment/Interventions Compensatory techniques;Functional tasks;Patient/family education;Compensatory strategies;SLP instruction and feedback;Other (comment)   Potential to Achieve Goals Good   Potential Considerations Ability to learn/carryover information;Co-morbidities;Cooperation/participation level;Medical prognosis;Pain level;Previous level of function;Severity of impairments;Family/community support   SLP Home Exercise Plan Semantic feature/sentence construction  worksheet   Consulted and Agree with Plan of Care Patient      Patient will benefit from skilled therapeutic intervention in order to improve the following deficits and impairments:   Dysarthria and anarthria  Anomia  Dysphonia    Problem List There are no active problems to display for this patient.  Dollene Primrose, MS/CCC- SLP  Leandrew Koyanagi 10/12/2016, 11:02 AM  [place] [facility] MAIN [facility] SERVICES [address] [place], [place], [address] Phone: [phone]   Fax:  [phone]   Name: Angela Rowe MRN: [id number] Date of Birth: March 08, 1946

## 2016-10-16 ENCOUNTER — Ambulatory Visit: Payer: Self-pay | Admitting: Physical Therapy

## 2016-10-17 ENCOUNTER — Ambulatory Visit: Payer: Medicare Other | Admitting: Occupational Therapy

## 2016-10-17 ENCOUNTER — Encounter: Payer: Self-pay | Admitting: Physical Therapy

## 2016-10-17 ENCOUNTER — Ambulatory Visit: Payer: Medicare Other | Admitting: Speech Pathology

## 2016-10-17 ENCOUNTER — Ambulatory Visit: Payer: Medicare Other | Admitting: Physical Therapy

## 2016-10-17 ENCOUNTER — Encounter: Payer: Self-pay | Admitting: Occupational Therapy

## 2016-10-17 DIAGNOSIS — R2681 Unsteadiness on feet: Secondary | ICD-10-CM

## 2016-10-17 DIAGNOSIS — M6281 Muscle weakness (generalized): Secondary | ICD-10-CM

## 2016-10-17 DIAGNOSIS — R490 Dysphonia: Secondary | ICD-10-CM

## 2016-10-17 DIAGNOSIS — R262 Difficulty in walking, not elsewhere classified: Secondary | ICD-10-CM

## 2016-10-17 DIAGNOSIS — R471 Dysarthria and anarthria: Secondary | ICD-10-CM

## 2016-10-17 DIAGNOSIS — R278 Other lack of coordination: Secondary | ICD-10-CM

## 2016-10-17 DIAGNOSIS — R488 Other symbolic dysfunctions: Secondary | ICD-10-CM

## 2016-10-17 NOTE — Patient Instructions (Signed)
OT TREATMENT    Neuro muscular re-education:  Pt. worked on grasping 1/8" pegs with long nosed tweezers. Pt. was controlled while grasping. Pt. worked on tasks to sustain lateral pinch on resistive tweezers while grasping and moving 2" toothpick sticks from a horizontal flat position to a vertical position in order to place it in the holder. Pt. was able to sustain grasp while positioning and extending the wrist/hand in the necessary alignment needed to place the stick through the top of the holder. Pt. Worked on writing in small text, tracing, and copying. Pt. worked on typing with an adjusted typing speed of 4 wpm.  Therapeutic Exercise:  Selfcare:  Manual Therapy:

## 2016-10-17 NOTE — Therapy (Signed)
[place] MAIN [facility] SERVICES [address] [place], [place], [address] Phone: [phone]   Fax:  [phone]  Occupational Therapy Treatment/Discharge Summary  Patient Details  Name: Angela Rowe MRN: [id number] Date of Birth: May 12, 1946 Referring Provider: Laural Roes  Encounter Date: 10/17/2016      OT End of Session - 10/17/16 1710    Visit Number 3   Number of Visits 24   Date for OT Re-Evaluation 12/27/16   Authorization Type Medicare G code 3 of 10   OT Start Time 2633   OT Stop Time 1530   OT Time Calculation (min) 45 min   Activity Tolerance Patient tolerated treatment well   Behavior During Therapy [facility] for tasks assessed/performed      Past Medical History:  Diagnosis Date  . Hypertension    somewhat controlled; taking meds;   . Stroke ([place])    08/07/16    History reviewed. No pertinent surgical history.  There were no vitals filed for this visit.      Subjective Assessment - 10/17/16 1507    Subjective  Pt. reports this wil be her last OT session, as her AFLAC has not come through, and is concerned about her copays.                      OT Treatments/Exercises (OP) - 10/17/16 0001      Fine Motor Coordination   Other Fine Motor Exercises Pt. worked on grasping 1/8" pegs with long nosed tweezers. Pt. was controlled while grasping. Pt. worked on tasks to sustain lateral pinch on resistive tweezers while grasping and moving 2" toothpick sticks from a horizontal flat position to a vertical position in order to place it in the holder. Pt. was able to sustain grasp while positioning and extending the wrist/hand in the necessary alignment needed to place the stick through the top of the holder. Pt. Worked on writing in small text, tracing, and copying. Pt. worked on typing with an adjusted typing speed of 4 wpm.                     OT Long Term Goals - 10/04/16 1222      OT LONG TERM GOAL #1    Title Pt.  will improve coordination skills to be able to independently use a cellphone efficiently.   Baseline Pt. has difficulty   Time 12   Period Weeks   Status New     OT LONG TERM GOAL #2   Title Pt. will independently open jars.   Baseline pt. has difficulty   Time 12   Period Weeks   Status New     OT LONG TERM GOAL #3   Title Pt. will independently type  a 5 sentence paragraph with no errors.   Baseline Pt. has difficulty   Time 12   Period Weeks     OT LONG TERM GOAL #4   Title Pt. will write a 3 sentence paragraph, independently, efficiently wtih 100% legibility.   Baseline Pt. has difficulty   Time 12   Period Weeks   Status New     OT LONG TERM GOAL #5   Title --   Time --   Period --   Status --               Plan - 10/17/16 1711    Clinical Impression Statement Pt. reports this will be the last OT session  because her AFLAC has not come through. Pt. reports the copays are too much for multiple disciplines. Pt. conitnue to have difficultly writing small text., however has 100% legibility for larger handwriting, typing, and texting/cellphone use. Pt. education was provided about HEPs for typing, writing, and [facility] skills. OT services are discharged at this time per pt. Request. Goals have note been met as pt. Completed 3 treatment sessions.   Rehab Potential Good   Clinical Impairments Affecting Rehab Potential Postive indocators: Age, Family support; Negative Indicators: Multiple comorbidities.   OT Frequency 2x / week   OT Duration 12 weeks   OT Treatment/Interventions Self-care/ADL training;Patient/family education;Therapeutic activities;Therapeutic exercise;Manual Therapy   OT Home Exercise Plan Reviewed HEP with pt. for [facility] skills.   Consulted and Agree with Plan of Care Patient      Patient will benefit from skilled therapeutic intervention in order to improve the following deficits and impairments:  Decreased coordination, Impaired UE  functional use, Decreased cognition, Decreased strength  Visit Diagnosis: Muscle weakness (generalized)  Other lack of coordination    Problem List There are no active problems to display for this patient.   Harrel Carina, MS, OTR/L 10/17/2016, 5:27 PM  [place] MAIN [facility] SERVICES [address] [place], [place], [address] Phone: [phone]   Fax:  [phone]  Name: Angela Rowe MRN: [id number] Date of Birth: 06-07-1946

## 2016-10-17 NOTE — Therapy (Signed)
[place] [facility] MAIN [facility] [address] , [place], [address] Phone: [phone]   Fax:  [phone]  Physical Therapy Treatment  Patient Details  Name: Roselyn Beringlice E Peugh MRN: [id number] Date of Birth: 06-29-1946 Referring Provider: Dr. Emelda BrothersWarshaw  Encounter Date: 10/17/2016      PT End of Session - 10/17/16 1536    Visit Number 5   Number of Visits 9   Date for PT Re-Evaluation 10/24/16   Authorization Type gcode 5   Authorization Time Period 10   PT Start Time 1530   PT Stop Time 1615   PT Time Calculation (min) 45 min   Equipment Utilized During Treatment Gait belt   Activity Tolerance Patient tolerated treatment well   Behavior [facility] Therapy [facility] for tasks assessed/performed      Past Medical History:  Diagnosis Date  . Hypertension    somewhat controlled; taking meds;   . Stroke ([facility])    08/07/16    History reviewed. No pertinent surgical history.  There were no vitals filed for this visit.      Subjective Assessment - 10/17/16 1535    Subjective Patient reports doing well today; she reports that she stopped taking the statin drugs and she is feeling so much better in her legs. She reports having a good weekend celebrating her granddaughter's birthday; No new changes;    Pertinent History personal factors affecting rehab: lives alone, HTN (somewhat controlled), increased risk for falls;    How long can you sit comfortably? NA   How long can you stand comfortably? will have low back pain after standing for 10-15 min;    How long can you walk comfortably? 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 No/denies      TREATMENT:  Warm up on Nustep BUE/BLE level 2 x4 min (unbilled);  Leg press, BLE plate 84#90# X32x15, 440#105# x15 with  mod Vcs to slow down eccentric return; required min Vcs for positioning for better strengthening;  Leg press, BLE heel raises 90# x15, 105# x15  with min  VCs for correct positioning for better ankle strengthening;   Resisted walking: 12.5# forward/backward, side/side x4 way, x2 laps each direction with supervision for safety and min VCs to slow down eccentric return for better strengthening;   Star SLS, contralateral leg step outs in star pattern, x2 each LE with min A for balance and min VCs for sequencing and to improve weight shift for better stance control;  Standing on airex: Feet modified tandem: BUE ball pass side/side x10each foot in front; Feet together, BUE ball up/down with eyes on ball x10 reps; Feet modified tandem: Balloon taps each UE x3-4 min with cues to improve upper trunk control and hip strategies for better balance in stance;   Hamstring curl, HOIST, plate #5 [id number] with cues to slow down eccentric return for better strengthening;  Patient tolerated session well without increase in pain;                            PT Education - 10/17/16 1536    Education provided Yes   Education Details LE strengthening, balance exercise;    Person(s) Educated Patient   Methods Explanation;Verbal cues   Comprehension Verbalized understanding;Returned  demonstration;Verbal cues required             PT Long Term Goals - 09/26/16 1244      PT LONG TERM GOAL #1   Title Patient will be independent in home exercise program to improve strength/mobility for better functional independence with ADLs.   Time 4   Period Weeks   Status New     PT LONG TERM GOAL #2   Title Patient will increase Berg Balance score by > 6 points to demonstrate decreased fall risk during functional activities.   Time 4   Period Weeks   Status New     PT LONG TERM GOAL #3   Title  Patient will be independent with ascend/descend 12 steps using single UE in step over step  pattern without LOB. Patient will be independent with ascend/descend 12 steps using single UE in step over step pattern without LOB for safety in home.    Time 4   Period Weeks   Status New     PT LONG TERM GOAL #4   Title Patient will increase BLE gross strength to 4+/5 as to improve functional strength for independent gait, increased standing tolerance and increased ADL ability.   Time 4   Period Weeks   Status New               Plan - 10/17/16 1548    Clinical Impression Statement Instructed patient in advanced LE strengthening exercise with increased repetition for better strengthening. Patient required min VCs for correct positioning to improve strength; Also instructed patient in advanced balance exercise with standing on uneven surfaces and stepping outside base of support for increased balance challenge. She would benefit from additional skilled PT intervention to improve LE strength, balance/gait safety and return to PLOF.    Clinical Impairments Affecting Rehab Potential positive: good PLOF, negative: fall risk, lives alone, co-morbidities; Patient's clinical presentation is stable as she has made improvements and is now mod I with self care ADLs;    PT Frequency 2x / week   PT Duration 4 weeks   PT Treatment/Interventions ADLs/Self Care Home Management;Cryotherapy;Moist Heat;Neuromuscular re-education;Balance training;Therapeutic exercise;Therapeutic activities;Functional mobility training;Stair training;Gait training;Patient/family education   PT Next Visit Plan advance HEP, strengthening/balance   PT Home Exercise Plan continue as given;    Consulted and Agree with Plan of Care Patient      Patient will benefit from skilled therapeutic intervention in order to improve the following deficits and impairments:  Decreased endurance, Decreased activity tolerance, Decreased strength, Pain, Difficulty walking, Decreased mobility, Decreased balance, Postural dysfunction,  Decreased safety awareness  Visit Diagnosis: Unsteadiness on feet  Difficulty in walking, not elsewhere classified  Muscle weakness (generalized)     Problem List There are no active problems to display for this patient.   Trotter,Margaret PT, DPT 10/17/2016, 4:17 PM  [place] [facility] MAIN [facility] SERVICES [address] [place], [place], [address] Phone: [phone]   Fax:  [phone]  Name: NICOLE DEFINO MRN: [id number] Date of Birth: 10/08/1945

## 2016-10-18 ENCOUNTER — Encounter: Payer: Self-pay | Admitting: Speech Pathology

## 2016-10-18 NOTE — Therapy (Signed)
[place] MAIN [facility] SERVICES [address] [place], [place], [address] Phone: [phone]   Fax:  [phone]  Speech Language Pathology Treatment/Discharge Summary  Patient Details  Name: Angela Rowe MRN: [id number] Date of Birth: [birth year] Referring Provider: Hoover Brunette   Encounter Date: 10/17/2016      End of Session - 10/18/16 0945    Visit Number 4   Number of Visits 13   Date for SLP Re-Evaluation 11/17/16   SLP Start Time 1610   SLP Stop Time  8841   SLP Time Calculation (min) 35 min   Activity Tolerance Patient tolerated treatment well      Past Medical History:  Diagnosis Date  . Hypertension    somewhat controlled; taking meds;   . Stroke ([place])    08/07/16    History reviewed. No pertinent surgical history.  There were no vitals filed for this visit.      Subjective Assessment - 10/18/16 0944    Subjective "My speech is improving everyday!"   Currently in Pain? No/denies               ADULT SLP TREATMENT - 10/18/16 0001      General Information   Behavior/Cognition Alert;Cooperative;Pleasant mood     Treatment Provided   Treatment provided Cognitive-Linquistic     Pain Assessment   Pain Assessment No/denies pain     Cognitive-Linquistic Treatment   Treatment focused on Voice;Dysarthria;Aphasia   Skilled Treatment SPEECH: Patient able to read aloud complex sentences / multi-syllabic words as well as carry on casual conversation, maintaining accurate phonemic placement and organization.  Patient given tongue twisters for practice.  VOICE: Patient given breath support exercises and sustained back vowels (with full/resonant voice).  Patient demonstrates improved vocal quality today and states she "sounds like myself".   WORD FINDING: No overt word finding deficits in linguistic tasks or conversational speech.     Assessment / Recommendations / Plan   Plan Discharge SLP treatment due to  (comment);All goals met     Progression Toward Goals   Progression toward goals Goals met, education completed, patient discharged from [facility] [place] Education - 10/18/16 0944    Education provided Yes   Education Details Speak as slowly as you need to be understood.  The rate is within normal limits and is only a change for you- others will not notice.   Person(s) Educated Patient   Methods Explanation   Comprehension Verbalized understanding            SLP Long Term Goals - 10/18/16 0947      SLP LONG TERM GOAL #1   Title Pt will improve speech intelligibility in response to moderately complex cognitive linguistic tasks by correct phonetic placement, controlling rate of speech, over-articulation, and increased loudness to achieve 80% intelligibility.   Time 6   Period Weeks   Status Achieved     SLP LONG TERM GOAL #2   Title Pt will improve vocal quality in conversation by using good breath support and control to achieve 80% intelligibility with min effort.   Time 6   Period Weeks   Status Achieved     SLP LONG TERM GOAL #3   Title Pt will complete high level word finding tasks with 80% accuracy.   Time 6   Period Weeks   Status Achieved          Plan - 10/18/16 0945  Clinical Impression Statement The patient's articulatory precision /organization, word finding, and vocal quality are within functional limits.  The patient has met her goals and reports that she feels that she is getting "everyday" and has materials and suggestions for continued improvement.     Speech Therapy Frequency Other (comment)  Discharge   Duration Other (comment)  Discharge   Treatment/Interventions Compensatory techniques;Functional tasks;Patient/family education;Compensatory strategies;SLP instruction and feedback;Other (comment)   Potential to Achieve Goals Good   Potential Considerations Ability to learn/carryover information;Co-morbidities;Cooperation/participation  level;Medical prognosis;Pain level;Previous level of function;Severity of impairments;Family/community support   SLP Home Exercise Plan Read outloud to practice speech, [employer] for reading comprehension practice   Consulted and Agree with Plan of Care Patient      Patient will benefit from skilled therapeutic intervention in order to improve the following deficits and impairments:   Dysarthria and anarthria  Anomia  Dysphonia      G-Codes - 14-Nov-2016 0948    Functional Assessment Tool Used clinical judgment   Functional Limitations Motor speech   Motor Speech Current Status [phone]) At least 1 percent but less than 20 percent impaired, limited or restricted   Motor Speech Goal Status (Y8657) At least 1 percent but less than 20 percent impaired, limited or restricted   Motor Speech Goal Status (Q4696) At least 1 percent but less than 20 percent impaired, limited or restricted      Problem List There are no active problems to display for this patient.  Leroy Sea, MS/CCC- SLP  Lou Miner Nov 14, 2016, 9:49 AM  [place] MAIN [facility] SERVICES [address] [place], [place], [address] Phone: [phone]   Fax:  [phone]   Name: Angela Rowe MRN: [id number] Date of Birth: 04-10-1946

## 2016-10-19 ENCOUNTER — Encounter: Payer: Self-pay | Admitting: Occupational Therapy

## 2016-10-19 ENCOUNTER — Ambulatory Visit: Payer: Medicare Other | Admitting: Speech Pathology

## 2016-10-19 ENCOUNTER — Ambulatory Visit: Payer: Medicare Other | Admitting: Physical Therapy

## 2016-10-24 ENCOUNTER — Encounter: Payer: Self-pay | Admitting: Speech Pathology

## 2016-10-24 ENCOUNTER — Encounter: Payer: Self-pay | Admitting: Physical Therapy

## 2016-10-24 ENCOUNTER — Ambulatory Visit: Payer: Medicare Other | Admitting: Physical Therapy

## 2016-10-24 DIAGNOSIS — M6281 Muscle weakness (generalized): Secondary | ICD-10-CM

## 2016-10-24 DIAGNOSIS — R262 Difficulty in walking, not elsewhere classified: Secondary | ICD-10-CM

## 2016-10-24 DIAGNOSIS — R2681 Unsteadiness on feet: Secondary | ICD-10-CM

## 2016-10-24 DIAGNOSIS — R278 Other lack of coordination: Secondary | ICD-10-CM | POA: Diagnosis not present

## 2016-10-24 NOTE — Therapy (Signed)
[place] MAIN [facility] SERVICES [place], [place], [address] Phone: [phone]   Fax:  [phone]  Physical Therapy Treatment  Patient Details  Name: Angela Rowe MRN: [id number] Date of Birth: 01/14/1946 Referring Provider: Dr. Dyke Maes  Encounter Date: 10/24/2016      PT End of Session - 10/24/16 1521    Visit Number 6   Number of Visits 17   Date for PT Re-Evaluation 11/21/16   Authorization Type gcode 6   Authorization Time Period 10   PT Start Time 1515   PT Stop Time 1600   PT Time Calculation (min) 45 min   Equipment Utilized During Treatment Gait belt   Activity Tolerance Patient tolerated treatment well;No increased pain   Behavior During Therapy WFL for tasks assessed/performed      Past Medical History:  Diagnosis Date  . Hypertension    somewhat controlled; taking meds;   . Stroke ([place])    08/07/16    History reviewed. No pertinent surgical history.  There were no vitals filed for this visit.      Subjective Assessment - 10/24/16 1520    Subjective Patient reports doing well; She reports being discharged from speech and occupational therapy; Patient reports increased soreness in left knee today which could be related to OA due to bad weather. No other new changes;    Pertinent History personal factors affecting rehab: lives alone, HTN (somewhat controlled), increased risk for falls;    How long can you sit comfortably? NA   How long can you stand comfortably? will have low back pain after standing for 10-15 min;    How long can you walk comfortably? 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 Yes   Pain Score 5    Pain Location Knee   Pain Orientation Left   Pain Descriptors / Indicators Aching;Dull;Sore         TREATMENT:  Warm up on Nustep BUE/BLE level 2 x4 min (unbilled);  Instructed patient in outcome measures to assess progress towards goals; see below; She did require min VCs for correct exercise technique;   Standing on airex: One foot on 4 inch step, one foot on airex BUE ball pass side/side x10each foot in front; Feet together, BUE balloon taps 4 min with cues to improve upper trunk control and keep feet together for increased balance challenge;  Ascend/descend 4 steps, reciprocally with B rail; Patient descends steps 1 step at a time with 1 rail;  SLS: 3-5 sec each LE on firm surface, unsupported; Patient demonstrates slight unsteadiness on RLE as compared to LLE;        [facility] PT Assessment - 10/24/16 0001      Strength   Overall Strength Comments 5/5 in BLE;      6 minute walk test results    Aerobic Endurance Distance Walked 1100   Endurance additional comments without AD, community ambulator; slight increase in shortness of breath; less than age group norms of 1600 feet; improved from 09/26/16 which was 1005 feet;     Berg Balance Test   Sit to Stand Able to stand without using hands and stabilize independently   Standing Unsupported Able to stand safely 2 minutes   Sitting with Back Unsupported but Feet Supported on  Floor or Stool Able to sit safely and securely 2 minutes   Stand to Sit Sits safely with minimal use of hands   Transfers Able to transfer safely, minor use of hands   Standing Unsupported with Eyes Closed Able to stand 10 seconds safely   Standing Ubsupported with Feet Together Able to place feet together independently and stand 1 minute safely   From Standing, Reach Forward with Outstretched Arm Can reach confidently >25 cm (10")   From Standing Position, Pick up Object from Floor Able to pick up shoe safely and easily   From Standing Position, Turn to Look Behind Over each Shoulder Looks behind from both sides and weight shifts well   Turn 360  Degrees Able to turn 360 degrees safely in 4 seconds or less   Standing Unsupported, Alternately Place Feet on Step/Stool Able to stand independently and safely and complete 8 steps in 20 seconds   Standing Unsupported, One Foot in Front Able to plae foot ahead of the other independently and hold 30 seconds   Standing on One Leg Able to lift leg independently and hold equal to or more than 3 seconds   Total Score 53   Berg comment: <50% risk for falls; improved from 09/26/16 which was 46/56                             PT Education - 10/24/16 1521    Education provided Yes   Education Details strengthening, balance, gait safety;    Person(s) Educated Patient   Methods Explanation;Verbal cues   Comprehension Verbalized understanding;Returned demonstration;Verbal cues required             PT Long Term Goals - 10/24/16 1534      PT LONG TERM GOAL #1   Title Patient will be independent in home exercise program to improve strength/mobility for better functional independence with ADLs.   Time 4   Period Weeks   Status On-going     PT LONG TERM GOAL #2   Title Patient will increase Berg Balance score by > 6 points to demonstrate decreased fall risk during functional activities.   Time 4   Period Weeks   Status Achieved     PT LONG TERM GOAL #3   Title  Patient will be independent with ascend/descend 12 steps using single UE rail in step over step pattern without LOB for increased safety in the home.    Time 4   Period Weeks   Status Partially Met     PT LONG TERM GOAL #4   Title Patient will increase BLE gross strength to 4+/5 as to improve functional strength for independent gait, increased standing tolerance and increased ADL ability.   Time 4   Period Weeks   Status Achieved     PT LONG TERM GOAL #5   Title Patient will ambulate >1200 feet on 6 min walk test to demonstrate better gait endurance and better community ambulation for return to PLOF.     Time 4   Period Weeks   Status New               Plan - 10/24/16 1652    Clinical Impression Statement Instructed patient in outcome measures to assess progress towards goals. Patient demonstrates significant improvement in balance as evidenced by improved score on Berg Balance Assessment. Patient demonstrates slight improvement in 6 min walk but is still limited against age group norms.  She continues to have fatigue and shortness of breath with prolonged ambulation. Patient also has difficulty descending stairs with only 1 rail. She would benefit from additional skilled PT intervention to improve LE balance and mobility and return to PLOF.    Clinical Impairments Affecting Rehab Potential positive: good PLOF, negative: fall risk, lives alone, co-morbidities; Patient's clinical presentation is stable as she has made improvements and is now mod I with self care ADLs;    PT Frequency 2x / week   PT Duration 4 weeks   PT Treatment/Interventions ADLs/Self Care Home Management;Cryotherapy;Moist Heat;Neuromuscular re-education;Balance training;Therapeutic exercise;Therapeutic activities;Functional mobility training;Stair training;Gait training;Patient/family education   PT Next Visit Plan advance HEP, strengthening/balance   PT Home Exercise Plan continue as given;    Consulted and Agree with Plan of Care Patient      Patient will benefit from skilled therapeutic intervention in order to improve the following deficits and impairments:  Decreased endurance, Decreased activity tolerance, Decreased strength, Pain, Difficulty walking, Decreased mobility, Decreased balance, Postural dysfunction, Decreased safety awareness  Visit Diagnosis: Unsteadiness on feet - Plan: PT plan of care cert/re-cert  Difficulty in walking, not elsewhere classified - Plan: PT plan of care cert/re-cert  Muscle weakness (generalized) - Plan: PT plan of care cert/re-cert     Problem List There are no active  problems to display for this patient.   Trotter,Margaret PT, DPT 10/24/2016, 5:07 PM  [place] MAIN [facility] SERVICES [address] [place], [place], [address] Phone: [phone]   Fax:  [phone]  Name: Angela Rowe MRN: [id number] Date of Birth: Mar 25, 1946

## 2016-10-26 ENCOUNTER — Encounter: Payer: Self-pay | Admitting: Occupational Therapy

## 2016-10-26 ENCOUNTER — Encounter: Payer: Self-pay | Admitting: Physical Therapy

## 2016-10-26 ENCOUNTER — Encounter: Payer: Self-pay | Admitting: Speech Pathology

## 2016-10-26 ENCOUNTER — Ambulatory Visit: Payer: Medicare Other | Admitting: Physical Therapy

## 2016-10-26 DIAGNOSIS — R2681 Unsteadiness on feet: Secondary | ICD-10-CM

## 2016-10-26 DIAGNOSIS — R278 Other lack of coordination: Secondary | ICD-10-CM | POA: Diagnosis not present

## 2016-10-26 DIAGNOSIS — R262 Difficulty in walking, not elsewhere classified: Secondary | ICD-10-CM

## 2016-10-26 DIAGNOSIS — M6281 Muscle weakness (generalized): Secondary | ICD-10-CM

## 2016-10-26 NOTE — Therapy (Signed)
[place] MAIN [facility] SERVICES [address] [place], [place], [address] Phone: [phone]   Fax:  [phone]  Physical Therapy Treatment  Patient Details  Name: Angela Rowe MRN: [id number] Date of Birth: May 27, 1946 Referring Provider: Dr. Dyke Maes  Encounter Date: 10/26/2016      PT End of Session - 10/26/16 1537    Visit Number 7   Number of Visits 17   Date for PT Re-Evaluation 11/21/16   Authorization Type gcode 7   Authorization Time Period 10   PT Start Time 1518   PT Stop Time 1600   PT Time Calculation (min) 42 min   Equipment Utilized During Treatment Gait belt   Activity Tolerance Patient tolerated treatment well;No increased pain   Behavior During Therapy WFL for tasks assessed/performed      Past Medical History:  Diagnosis Date  . Hypertension    somewhat controlled; taking meds;   . Stroke ([place])    08/07/16    History reviewed. No pertinent surgical history.  There were no vitals filed for this visit.      Subjective Assessment - 10/26/16 1536    Subjective Patient reports doing well; She denies any pain; Reports no new falls;    Pertinent History personal factors affecting rehab: lives alone, HTN (somewhat controlled), increased risk for falls;    How long can you sit comfortably? NA   How long can you stand comfortably? will have low back pain after standing for 10-15 min;    How long can you walk comfortably? 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 No/denies        TREATMENT: Warm up on Cross trainer x2 min with cues to increase speed for better cardiovascular endurance;   Leg press, BLE plate 105# 5A56 with mod Vcs to slow down eccentric return; required min Vcs for positioning for better strengthening;   Leg press, BLE heel raises 105# 2x12  with min VCs for correct positioning for better ankle strengthening;   Resisted walking: 17.5# forward/backward, side/side x4 way, x2 laps each direction with supervision for safety and min VCs to slow down eccentric return for better strengthening;    Standing on 1/2 bolster: Heel/toe raises x15 with B rail assist; Feet balance, BUE wand flexion x10 with min VCs to improve upper trunk control and min A for balance control;  Forward lunges unsupported with arms by side and with BUE overhead reach with head up x5each, bilaterally; Patient required CGA for safety; She demonstrates increased unsteadiness with overhead reach;    Hamstring curl, HOIST, plate #5 [id number] with cues to slow down eccentric return for better strengthening;  Seated: Foot rocker forward/backward x1 min;                            PT Education - 10/26/16 1537    Education provided Yes   Education Details strengthening, balance exercise, gait safety; HEP reinforced;    Person(s) Educated Patient   Methods Explanation;Verbal cues   Comprehension Verbalized understanding;Returned demonstration;Verbal cues required             PT Long Term Goals - 10/24/16 1534      PT LONG TERM GOAL #1   Title Patient will  be independent in home exercise program to improve strength/mobility for better functional independence with ADLs.   Time 4   Period Weeks   Status On-going     PT LONG TERM GOAL #2   Title Patient will increase Berg Balance score by > 6 points to demonstrate decreased fall risk during functional activities.   Time 4   Period Weeks   Status Achieved     PT LONG TERM GOAL #3   Title  Patient will be independent with ascend/descend 12 steps using single UE rail in step over step pattern without LOB for increased safety in the home.    Time 4   Period Weeks   Status Partially Met     PT LONG TERM GOAL #4   Title Patient will  increase BLE gross strength to 4+/5 as to improve functional strength for independent gait, increased standing tolerance and increased ADL ability.   Time 4   Period Weeks   Status Achieved     PT LONG TERM GOAL #5   Title Patient will ambulate >1200 feet on 6 min walk test to demonstrate better gait endurance and better community ambulation for return to PLOF.    Time 4   Period Weeks   Status New               Plan - 10/26/16 1538    Clinical Impression Statement Patient instructed in advanced LE strengthening exercise. She was able to perform increased resistance/repetition with less discomfort. Patient continues to require min VCs for correct positioning and to slow down LE movement for better strengthening. She would benefit from additional skilled PT Intervention to improve strength, balance and gait safety;    Clinical Impairments Affecting Rehab Potential positive: good PLOF, negative: fall risk, lives alone, co-morbidities; Patient's clinical presentation is stable as she has made improvements and is now mod I with self care ADLs;    PT Frequency 2x / week   PT Duration 4 weeks   PT Treatment/Interventions ADLs/Self Care Home Management;Cryotherapy;Moist Heat;Neuromuscular re-education;Balance training;Therapeutic exercise;Therapeutic activities;Functional mobility training;Stair training;Gait training;Patient/family education   PT Next Visit Plan advance HEP, strengthening/balance   PT Home Exercise Plan continue as given;    Consulted and Agree with Plan of Care Patient      Patient will benefit from skilled therapeutic intervention in order to improve the following deficits and impairments:  Decreased endurance, Decreased activity tolerance, Decreased strength, Pain, Difficulty walking, Decreased mobility, Decreased balance, Postural dysfunction, Decreased safety awareness  Visit Diagnosis: Unsteadiness on feet  Difficulty in walking, not elsewhere  classified  Muscle weakness (generalized)     Problem List There are no active problems to display for this patient.    Antrell Tipler PT, DPT 10/26/2016, 3:58 PM  [place] MAIN [facility] SERVICES [address] [place], [place], [address] Phone: [phone]   Fax:  [phone]  Name: DEMESHA BOORMAN MRN: [id number] Date of Birth: 08/26/1946

## 2016-10-31 ENCOUNTER — Ambulatory Visit: Payer: Medicare Other | Admitting: Physical Therapy

## 2016-10-31 ENCOUNTER — Encounter: Payer: Self-pay | Admitting: Speech Pathology

## 2016-10-31 ENCOUNTER — Encounter: Payer: Self-pay | Admitting: Physical Therapy

## 2016-10-31 ENCOUNTER — Encounter: Payer: Self-pay | Admitting: Occupational Therapy

## 2016-10-31 DIAGNOSIS — R262 Difficulty in walking, not elsewhere classified: Secondary | ICD-10-CM

## 2016-10-31 DIAGNOSIS — M6281 Muscle weakness (generalized): Secondary | ICD-10-CM

## 2016-10-31 DIAGNOSIS — R278 Other lack of coordination: Secondary | ICD-10-CM | POA: Diagnosis not present

## 2016-10-31 DIAGNOSIS — R2681 Unsteadiness on feet: Secondary | ICD-10-CM

## 2016-10-31 NOTE — Patient Instructions (Addendum)
Achilles Tendon Stretch   Stand with hands supported on wall, elbows slightly bent, feet parallel and both heels on floor, front knee bent, back knee straight. Slowly relax back knee until a stretch is felt in achilles tendon. Hold _20___ seconds. Repeat with leg positions switched.   Copyright  VHI. All rights reserved.  ANKLE: Dorsiflexion, Step Unilateral   Stand on step, hang one heel off back of step. Hold _20__ seconds. __3_ reps per set, __2_ sets per day, _5_ days per week Hold onto a support.  Copyright  VHI. All rights reserved.    Chair Sitting    Sit at edge of seat, spine straight, one leg extended.Sitting up straight, lean chest forward, arching low back keeping good posture.  Hold 15___ seconds. Repeat __2_ times per session. Do _2__ sessions per day.  Copyright  VHI. All rights reserved.  Hamstring Stretch    Stand with one heel on step, knee straight. Lean forward from hip, be sure to keep a good posture and lean chest forward, arching low back. Hold _15__ seconds. Perform _2__ reps.  Copyright  VHI. All rights reserved.

## 2016-10-31 NOTE — Therapy (Signed)
[place] MAIN [facility] SERVICES [place], [place], [address] Phone: [phone]   Fax:  [phone]  Physical Therapy Treatment  Patient Details  Name: Angela Rowe MRN: [id number] Date of Birth: Aug 27, 1946 Referring Provider: Dr. Dyke Maes  Encounter Date: 10/31/2016      PT End of Session - 10/31/16 1145    Visit Number 8   Number of Visits 17   Date for PT Re-Evaluation 11/21/16   Authorization Type gcode 8   Authorization Time Period 10   PT Start Time 1102   PT Stop Time 1143   PT Time Calculation (min) 41 min   Equipment Utilized During Treatment Gait belt   Activity Tolerance Patient tolerated treatment well;No increased pain   Behavior During Therapy WFL for tasks assessed/performed      Past Medical History:  Diagnosis Date  . Hypertension    somewhat controlled; taking meds;   . Stroke ([place])    08/07/16    History reviewed. No pertinent surgical history.  There were no vitals filed for this visit.      Subjective Assessment - 10/31/16 1145    Subjective Patient reports doing well. She states, "I feel like I am getting my stamina back. I really do feel like I am getting more like myself."    Pertinent History personal factors affecting rehab: lives alone, HTN (somewhat controlled), increased risk for falls;    How long can you sit comfortably? NA   How long can you stand comfortably? will have low back pain after standing for 10-15 min;    How long can you walk comfortably? 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 No/denies            TREATMENT: Warm up on Cross trainer x3 min, level 1 with cues to increase speed for better cardiovascular endurance;     Educated patient in LE stretches to reduce  tightness: Standing heel off step stretch 15 sec hold x2 bilaterally; Standing heel on step, hamstring stretch 15 sec hold x2 with cues for erect posture to improve hamstring stretch; Seated hamstring with ankle DF for calf stretch 15 sec hold x2 bilaterally with min VCs to avoid forward bend for better stretch; Standing calf stretch against wall 15 sec hold x1 bilaterally;   Leg press, BLE plate 105# T62, 263# x15 with mod Vcs to slow down eccentric return; required min Vcs for positioning for better strengthening;  Leg press, BLE heel raises 105# x20with min VCs for correct positioning for better ankle strengthening;   Resisted walking: 12.5# forward/backward, side/side x4 way, x2 laps each, on red mat for increased challenge with balance, with supervision for safety and min VCs to slow down eccentric return for better strengthening;    Forward lunges unsupported with arms by side and with BUE overhead resisted yellow ball lift with head up x5each, bilaterally; Patient required CGA for safety; She had a harder time with stepping forward with LLE;   SLS: firm surface 5 sec, RLE; 3 sec LLE;   SLS with toe taps on stepping stones #3, in forward/lateral progression x3 min each LE; Patient had increased difficulty with LLE SLS, she did require min A for advanced balance exercise;  PT Education - 10/31/16 1145    Education provided Yes   Education Details strengthening, balance, HEP advanced;    Person(s) Educated Patient   Methods Explanation;Verbal cues;Handout   Comprehension Verbalized understanding;Returned demonstration;Verbal cues required             PT Long Term Goals - 10/24/16 1534      PT LONG TERM GOAL #1   Title Patient will be independent in home exercise program to improve strength/mobility for better functional independence with ADLs.   Time 4   Period Weeks   Status On-going     PT LONG TERM GOAL #2   Title Patient  will increase Berg Balance score by > 6 points to demonstrate decreased fall risk during functional activities.   Time 4   Period Weeks   Status Achieved     PT LONG TERM GOAL #3   Title  Patient will be independent with ascend/descend 12 steps using single UE rail in step over step pattern without LOB for increased safety in the home.    Time 4   Period Weeks   Status Partially Met     PT LONG TERM GOAL #4   Title Patient will increase BLE gross strength to 4+/5 as to improve functional strength for independent gait, increased standing tolerance and increased ADL ability.   Time 4   Period Weeks   Status Achieved     PT LONG TERM GOAL #5   Title Patient will ambulate >1200 feet on 6 min walk test to demonstrate better gait endurance and better community ambulation for return to PLOF.    Time 4   Period Weeks   Status New               Plan - 10/31/16 1146    Clinical Impression Statement Patient able to tolerate increased strengthening in LE well. Increased resistance/repetition for better strengthening. educated patient in LE stretches to reduce tightness and discomfort. Patient continues to have trouble with advanced balance exercise, particulary with SLS. She would benefit from additional skilled PT intervention to improve balance/gait safety and reduce fall risk;    Clinical Impairments Affecting Rehab Potential positive: good PLOF, negative: fall risk, lives alone, co-morbidities; Patient's clinical presentation is stable as she has made improvements and is now mod I with self care ADLs;    PT Frequency 2x / week   PT Duration 4 weeks   PT Treatment/Interventions ADLs/Self Care Home Management;Cryotherapy;Moist Heat;Neuromuscular re-education;Balance training;Therapeutic exercise;Therapeutic activities;Functional mobility training;Stair training;Gait training;Patient/family education   PT Next Visit Plan advance HEP, strengthening/balance   PT Home Exercise Plan advanced-  see patient instructions;    Consulted and Agree with Plan of Care Patient      Patient will benefit from skilled therapeutic intervention in order to improve the following deficits and impairments:  Decreased endurance, Decreased activity tolerance, Decreased strength, Pain, Difficulty walking, Decreased mobility, Decreased balance, Postural dysfunction, Decreased safety awareness  Visit Diagnosis: Unsteadiness on feet  Difficulty in walking, not elsewhere classified  Muscle weakness (generalized)     Problem List There are no active problems to display for this patient.   Ch[place]uka Festa PT, DPT 10/31/2016, 11:50 AM  [place] MAIN [facility] SERVICES [address] [place], [place], [address] Phone: [phone]   Fax:  [phone]  Name: Angela Rowe MRN: [id number] Date of Birth: [birth year]

## 2016-11-02 ENCOUNTER — Ambulatory Visit: Payer: Medicare Other | Admitting: Physical Therapy

## 2016-11-02 ENCOUNTER — Encounter: Payer: Self-pay | Admitting: Speech Pathology

## 2016-11-02 ENCOUNTER — Encounter: Payer: Self-pay | Admitting: Physical Therapy

## 2016-11-02 ENCOUNTER — Encounter: Payer: Self-pay | Admitting: Occupational Therapy

## 2016-11-02 DIAGNOSIS — M6281 Muscle weakness (generalized): Secondary | ICD-10-CM

## 2016-11-02 DIAGNOSIS — R2681 Unsteadiness on feet: Secondary | ICD-10-CM

## 2016-11-02 DIAGNOSIS — R278 Other lack of coordination: Secondary | ICD-10-CM | POA: Diagnosis not present

## 2016-11-02 DIAGNOSIS — R262 Difficulty in walking, not elsewhere classified: Secondary | ICD-10-CM

## 2016-11-02 NOTE — Therapy (Signed)
[place] MAIN [facility] SERVICES [place], [place], [address] Phone: [phone]   Fax:  [phone]  Physical Therapy Treatment  Patient Details  Name: Angela Rowe MRN: [id number] Date of Birth: Feb 14, 1946 Referring Provider: Dr. Dyke Maes  Encounter Date: 11/02/2016      PT End of Session - 11/02/16 1108    Visit Number 9   Number of Visits 17   Date for PT Re-Evaluation 11/21/16   Authorization Type gcode 9   Authorization Time Period 10   PT Start Time 1103   PT Stop Time 1145   PT Time Calculation (min) 42 min   Equipment Utilized During Treatment Gait belt   Activity Tolerance Patient tolerated treatment well;No increased pain   Behavior During Therapy WFL for tasks assessed/performed      Past Medical History:  Diagnosis Date  . Hypertension    somewhat controlled; taking meds;   . Stroke ([place])    08/07/16    History reviewed. No pertinent surgical history.  There were no vitals filed for this visit.      Subjective Assessment - 11/02/16 1108    Subjective Patient reports doing really well. She states, "I am feeling so much better in my legs. I am not really hurting like I was last time."    Pertinent History personal factors affecting rehab: lives alone, HTN (somewhat controlled), increased risk for falls;    How long can you sit comfortably? NA   How long can you stand comfortably? will have low back pain after standing for 10-15 min;    How long can you walk comfortably? 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 No/denies         TREATMENT: Warm up on Nustep BUE/BLE level 2 x4 min (unbilled);   Hamstring curl: BLE plate #5 [id number] with cues to slow down LE movement for better strengthening;  Leg  press, BLE plate  102# [id number] mod Vcs to slow down eccentric return; required min Vcs for positioning for better strengthening;   Standing on 1/2 bolster (Flat side up): Feet apart: Heel/toe raises x15 with B rail assist with cues to avoid trunk movement to isolate ankle mobility; Feet apart: BUE wand flexion x10 with min A for safety and cues to improve ankle strategies for better balance; Tandem stance: unsupported, 10 sec hold x2 each foot in front with min A for balance; Tandem stance: BUE ball pass side/side x3 each foot in front;  SLS with toe taps on stepping stones #3, in forward/lateral progression x3 min each LE; Patient had increased difficulty with LLE SLS, she did require min A for advanced balance exercise;    Ladder drills: Forward reciprocal x2 laps; Forward with SLS 3 sec hold x2 laps; Out-out-in-in coordination drills x2 laps; Patient required min-moderate verbal/tactile cues for correct exercise technique including cues for sequencing and weight shift;   Standing: Hamstring stretch with foot on step 10 sec hold x2 bilaterally;                       PT Education - 11/02/16 1108    Education provided Yes   Education Details strengthening, balance; HEP reinforced;    Person(s) Educated Patient   Methods Explanation;Verbal cues  Comprehension Verbalized understanding;Returned demonstration;Verbal cues required             PT Long Term Goals - 10/24/16 1534      PT LONG TERM GOAL #1   Title Patient will be independent in home exercise program to improve strength/mobility for better functional independence with ADLs.   Time 4   Period Weeks   Status On-going     PT LONG TERM GOAL #2   Title Patient will increase Berg Balance score by > 6 points to demonstrate decreased fall risk during functional activities.   Time 4   Period Weeks   Status Achieved     PT LONG TERM GOAL #3   Title  Patient will be independent with  ascend/descend 12 steps using single UE rail in step over step pattern without LOB for increased safety in the home.    Time 4   Period Weeks   Status Partially Met     PT LONG TERM GOAL #4   Title Patient will increase BLE gross strength to 4+/5 as to improve functional strength for independent gait, increased standing tolerance and increased ADL ability.   Time 4   Period Weeks   Status Achieved     PT LONG TERM GOAL #5   Title Patient will ambulate >1200 feet on 6 min walk test to demonstrate better gait endurance and better community ambulation for return to PLOF.    Time 4   Period Weeks   Status New               Plan - 11/02/16 1251    Clinical Impression Statement Patient reports feeling better following LE stretches from last session; Instructed patient in advanced strengthening exercise. Patient able to increase resistance/repetition for better strengthening. Patient instructed in advanced balance exercise. She had increased difficulty with dynamic/static tasks especially with standing on uneven surfaces. Patient would benefit from additional skilled PT intervention to improve balance and safety with ADLs.    Clinical Impairments Affecting Rehab Potential positive: good PLOF, negative: fall risk, lives alone, co-morbidities; Patient's clinical presentation is stable as she has made improvements and is now mod I with self care ADLs;    PT Frequency 2x / week   PT Duration 4 weeks   PT Treatment/Interventions ADLs/Self Care Home Management;Cryotherapy;Moist Heat;Neuromuscular re-education;Balance training;Therapeutic exercise;Therapeutic activities;Functional mobility training;Stair training;Gait training;Patient/family education   PT Next Visit Plan advance HEP, strengthening/balance   PT Home Exercise Plan continue as given;    Consulted and Agree with Plan of Care Patient      Patient will benefit from skilled therapeutic intervention in order to improve the following  deficits and impairments:  Decreased endurance, Decreased activity tolerance, Decreased strength, Pain, Difficulty walking, Decreased mobility, Decreased balance, Postural dysfunction, Decreased safety awareness  Visit Diagnosis: Unsteadiness on feet  Difficulty in walking, not elsewhere classified  Muscle weakness (generalized)     Problem List There are no active problems to display for this patient.   Marise Knapper PT, DPT 11/02/2016, 12:54 PM  [place] MAIN [facility] SERVICES [address] [place], [place], [address] Phone: [phone]   Fax:  [phone]  Name: Angela Rowe MRN: [id number] Date of Birth: October 12, 1945

## 2016-11-07 ENCOUNTER — Ambulatory Visit: Payer: Medicare Other | Admitting: Physical Therapy

## 2016-11-07 ENCOUNTER — Encounter: Payer: Self-pay | Admitting: Occupational Therapy

## 2016-11-07 ENCOUNTER — Encounter: Payer: Self-pay | Admitting: Physical Therapy

## 2016-11-07 ENCOUNTER — Encounter: Payer: Self-pay | Admitting: Speech Pathology

## 2016-11-07 DIAGNOSIS — R262 Difficulty in walking, not elsewhere classified: Secondary | ICD-10-CM

## 2016-11-07 DIAGNOSIS — R2681 Unsteadiness on feet: Secondary | ICD-10-CM

## 2016-11-07 DIAGNOSIS — R278 Other lack of coordination: Secondary | ICD-10-CM | POA: Diagnosis not present

## 2016-11-07 DIAGNOSIS — M6281 Muscle weakness (generalized): Secondary | ICD-10-CM

## 2016-11-07 NOTE — Therapy (Signed)
[place] MAIN [facility] SERVICES [address] [place], [place], [address] Phone: [phone]   Fax:  [phone]  Physical Therapy Treatment  Patient Details  Name: Angela Rowe MRN: [id number] Date of Birth: 12-Dec-1945 Referring Provider: Dr. Dyke Maes  Encounter Date: 11/07/2016      PT End of Session - 11/07/16 1034    Visit Number 10   Number of Visits 17   Date for PT Re-Evaluation 11/21/16   Authorization Type gcode 10   Authorization Time Period 10   PT Start Time 1007   PT Stop Time 1045   PT Time Calculation (min) 38 min   Equipment Utilized During Treatment Gait belt   Activity Tolerance Patient tolerated treatment well;No increased pain   Behavior During Therapy WFL for tasks assessed/performed      Past Medical History:  Diagnosis Date  . Hypertension    somewhat controlled; taking meds;   . Stroke ([place])    08/07/16    History reviewed. No pertinent surgical history.  There were no vitals filed for this visit.      Subjective Assessment - 11/07/16 1033    Subjective Patient reports doing well; no pain; no new changes; reports compliance with HEP;    Pertinent History personal factors affecting rehab: lives alone, HTN (somewhat controlled), increased risk for falls;    How long can you sit comfortably? NA   How long can you stand comfortably? will have low back pain after standing for 10-15 min;    How long can you walk comfortably? 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 No/denies       TREATMENT: Warm up on Octane cross trainer x2 min with cues for positioning and to increase speed for better strengthening, level 2 resistance;  HOIST hamstring curl, BLE plate #5, 5G38 with cues to slow down eccentric return for  better strengthening;   Standing modified tandem stance on dyna disc: Unsupported, head turns side/side x5 each with cues to improve gaze stabilization for less loss of balance; Unsupported with BUE wand flexion 3# resistance x10 with CGA for safety and cues to improve weight shift for better stance control; Each performed with each foot in front;  Feet apart on dyna disc with 3# wand chest press with mini squat x10 with min A for safety;  SLS on firm surface with ball toss x5 each foot with cues for throwing overhand for better toss and to keep legs close together for better stance control;  Educated patient on how to advance balance exercise at home with adding head turns, eyes closed, arms overhead with tandem/SLS for better static balance;  Patient able to ascend/descend 6 steps (6 inch height) with 1 rail assist, forward reciprocally x2 reps demonstrating good dynamic balance;                          PT Education - 11/07/16 1034    Education provided Yes   Education Details strengthening, balance exercise, HEP reinforced;    Person(s) Educated Patient   Methods Explanation;Verbal cues   Comprehension Verbalized understanding;Returned demonstration;Verbal cues required             PT Long Term Goals - 10/24/16 1534  PT LONG TERM GOAL #1   Title Patient will be independent in home exercise program to improve strength/mobility for better functional independence with ADLs.   Time 4   Period Weeks   Status On-going     PT LONG TERM GOAL #2   Title Patient will increase Berg Balance score by > 6 points to demonstrate decreased fall risk during functional activities.   Time 4   Period Weeks   Status Achieved     PT LONG TERM GOAL #3   Title  Patient will be independent with ascend/descend 12 steps using single UE rail in step over step pattern without LOB for increased safety in the home.    Time 4   Period Weeks   Status Partially Met     PT  LONG TERM GOAL #4   Title Patient will increase BLE gross strength to 4+/5 as to improve functional strength for independent gait, increased standing tolerance and increased ADL ability.   Time 4   Period Weeks   Status Achieved     PT LONG TERM GOAL #5   Title Patient will ambulate >1200 feet on 6 min walk test to demonstrate better gait endurance and better community ambulation for return to PLOF.    Time 4   Period Weeks   Status New               Plan - 2016/11/21 1152    Clinical Impression Statement Patient instructed in advanced strengthening and balance exercise. She requires min Vcs to improve weight shift especially with tandem stance and with SLS for better static balance. Patient able to ascend/descend 6 steps with 1 rail, ascend/descend reciprocally for better stair negotiation. She would benefit from additional skilled PT intervention to improve balance and functional strength;    Clinical Impairments Affecting Rehab Potential positive: good PLOF, negative: fall risk, lives alone, co-morbidities; Patient's clinical presentation is stable as she has made improvements and is now mod I with self care ADLs;    PT Frequency 2x / week   PT Duration 4 weeks   PT Treatment/Interventions ADLs/Self Care Home Management;Cryotherapy;Moist Heat;Neuromuscular re-education;Balance training;Therapeutic exercise;Therapeutic activities;Functional mobility training;Stair training;Gait training;Patient/family education   PT Next Visit Plan advance HEP, strengthening/balance   PT Home Exercise Plan continue as given;    Consulted and Agree with Plan of Care Patient      Patient will benefit from skilled therapeutic intervention in order to improve the following deficits and impairments:  Decreased endurance, Decreased activity tolerance, Decreased strength, Pain, Difficulty walking, Decreased mobility, Decreased balance, Postural dysfunction, Decreased safety awareness  Visit  Diagnosis: Unsteadiness on feet  Difficulty in walking, not elsewhere classified  Muscle weakness (generalized)       G-Codes - 21-Nov-2016 1154    Functional Assessment Tool Used (Outpatient Only) stair negotiation, clinical judgement,    Functional Limitation Mobility: Walking and moving around   Mobility: Walking and Moving Around Current Status (W2956) At least 20 percent but less than 40 percent impaired, limited or restricted   Mobility: Walking and Moving Around Goal Status (O1308) At least 1 percent but less than 20 percent impaired, limited or restricted      Problem List There are no active problems to display for this patient.   Trotter,Margaret PT, DPT 21-Nov-2016, 11:58 AM  [place] MAIN [facility] SERVICES [address] [place], [place], [address] Phone: [phone]   Fax:  [phone]  Name: Angela Rowe MRN: [id number] Date of  Birth: Jan 26, 1946

## 2016-11-09 ENCOUNTER — Encounter: Payer: Self-pay | Admitting: Physical Therapy

## 2016-11-09 ENCOUNTER — Encounter: Payer: Self-pay | Admitting: Speech Pathology

## 2016-11-09 ENCOUNTER — Encounter: Payer: Self-pay | Admitting: Occupational Therapy

## 2016-11-09 ENCOUNTER — Ambulatory Visit: Payer: Medicare Other | Attending: Geriatric Medicine | Admitting: Physical Therapy

## 2016-11-09 DIAGNOSIS — M6281 Muscle weakness (generalized): Secondary | ICD-10-CM | POA: Insufficient documentation

## 2016-11-09 DIAGNOSIS — R262 Difficulty in walking, not elsewhere classified: Secondary | ICD-10-CM

## 2016-11-09 DIAGNOSIS — R2681 Unsteadiness on feet: Secondary | ICD-10-CM | POA: Insufficient documentation

## 2016-11-09 NOTE — Therapy (Signed)
[place] MAIN [facility] SERVICES [address] [place], [place], [address] Phone: [phone]   Fax:  [phone]  Physical Therapy Treatment  Patient Details  Name: Angela Rowe MRN: [id number] Date of Birth: 06/24/1946 Referring Provider: Dr. Dyke Maes  Encounter Date: 11/09/2016      PT End of Session - 11/09/16 1117    Visit Number 11   Number of Visits 17   Date for PT Re-Evaluation 11/21/16   Authorization Type gcode 1   Authorization Time Period 10   PT Start Time 1104   PT Stop Time 1145   PT Time Calculation (min) 41 min   Equipment Utilized During Treatment Gait belt   Activity Tolerance Patient tolerated treatment well;No increased pain   Behavior During Therapy WFL for tasks assessed/performed      Past Medical History:  Diagnosis Date  . Hypertension    somewhat controlled; taking meds;   . Stroke ([place])    08/07/16    History reviewed. No pertinent surgical history.  There were no vitals filed for this visit.      Subjective Assessment - 11/09/16 1112    Subjective Patient presents to therapy today with bandage on right lower leg. She states, "I think that I cut my leg shaving and it has an infection. I have started putting antibiotic cream on it."    Pertinent History personal factors affecting rehab: lives alone, HTN (somewhat controlled), increased risk for falls;    How long can you sit comfortably? NA   How long can you stand comfortably? will have low back pain after standing for 10-15 min;    How long can you walk comfortably? 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 No/denies             TREATMENT:  HOIST hamstring curl, BLE plate #5 8S50 with cues to increase knee flexion for better hamstring  strengthening;  Leg press, BLE plate 120# 5L97 with cues to avoid excessive knee extension (avoid locking knees) for better quad control; Leg press, BLE plate 120# heel raises 2x10 with cues for positioning for better strengthening;   Standing hamstring stretch on step 15 sec hold x2 reps, x2 sets bilaterally;  Sit<>Stand from low mat table with 3# bar overhead lift x15 with cues for forward lean for better sit to stand ability;   Tandem stance on upside down 1/2 bolster: Unsupported, head turns side/side x5 each with cues to improve gaze stabilization for less loss of balance; BUE overhead reach x5 each foot in front with CGA for safety; Heel/toe rock with feet apart for increased ankle stretch x15;  Standing on airex: Feet apart, balloon taps x3 min with good balance and good trunk control;  SLS on firm surface with ball toss x5 each foot with cues for throwing overhand for better toss and to keep legs close together for better stance control;  Forward lunges on firm surface with BUE ball side/side x5 forward, x5 backwards; Side step in squat position with ball toss x20 feet each direction;  Patient required min-moderate verbal/tactile cues for correct exercise technique.                     PT Education - 11/09/16 1116    Education provided  Yes   Education Details strengthening, balance exercise, HEP reinforced;    Person(s) Educated Patient   Methods Explanation;Verbal cues   Comprehension Verbalized understanding;Returned demonstration;Verbal cues required             PT Long Term Goals - 10/24/16 1534      PT LONG TERM GOAL #1   Title Patient will be independent in home exercise program to improve strength/mobility for better functional independence with ADLs.   Time 4   Period Weeks   Status On-going     PT LONG TERM GOAL #2   Title Patient will increase Berg Balance score by > 6 points to demonstrate decreased fall risk during functional  activities.   Time 4   Period Weeks   Status Achieved     PT LONG TERM GOAL #3   Title  Patient will be independent with ascend/descend 12 steps using single UE rail in step over step pattern without LOB for increased safety in the home.    Time 4   Period Weeks   Status Partially Met     PT LONG TERM GOAL #4   Title Patient will increase BLE gross strength to 4+/5 as to improve functional strength for independent gait, increased standing tolerance and increased ADL ability.   Time 4   Period Weeks   Status Achieved     PT LONG TERM GOAL #5   Title Patient will ambulate >1200 feet on 6 min walk test to demonstrate better gait endurance and better community ambulation for return to PLOF.    Time 4   Period Weeks   Status New               Plan - 11/09/16 1121    Clinical Impression Statement Patient reports increased stiffness today due to bad weather. Instructed patient in BLE strengthening and ROM exercise to facilitate better mobility; She demonstrates improved standing balance with less unsteadiness on foam during balloon taps. She continues to have some difficulty with SLS tasks. She would benefit from additional skilled PT intervention to improve balance, gait safety and strength;    Clinical Impairments Affecting Rehab Potential positive: good PLOF, negative: fall risk, lives alone, co-morbidities; Patient's clinical presentation is stable as she has made improvements and is now mod I with self care ADLs;    PT Frequency 2x / week   PT Duration 4 weeks   PT Treatment/Interventions ADLs/Self Care Home Management;Cryotherapy;Moist Heat;Neuromuscular re-education;Balance training;Therapeutic exercise;Therapeutic activities;Functional mobility training;Stair training;Gait training;Patient/family education   PT Next Visit Plan advance HEP, strengthening/balance   PT Home Exercise Plan continue as given;    Consulted and Agree with Plan of Care Patient      Patient will  benefit from skilled therapeutic intervention in order to improve the following deficits and impairments:  Decreased endurance, Decreased activity tolerance, Decreased strength, Pain, Difficulty walking, Decreased mobility, Decreased balance, Postural dysfunction, Decreased safety awareness  Visit Diagnosis: Unsteadiness on feet  Difficulty in walking, not elsewhere classified  Muscle weakness (generalized)     Problem List There are no active problems to display for this patient.   Gunhild Bautch PT, DPT 11/09/2016, 11:49 AM  [place] MAIN [facility] SERVICES [address] [place], [place], [address] Phone: [phone]   Fax:  [phone]  Name: Angela Rowe MRN: [id number] Date of Birth: [birth year]

## 2016-11-14 ENCOUNTER — Encounter: Payer: Self-pay | Admitting: Physical Therapy

## 2016-11-14 ENCOUNTER — Ambulatory Visit: Payer: Medicare Other | Admitting: Physical Therapy

## 2016-11-14 DIAGNOSIS — R2681 Unsteadiness on feet: Secondary | ICD-10-CM

## 2016-11-14 DIAGNOSIS — M6281 Muscle weakness (generalized): Secondary | ICD-10-CM

## 2016-11-14 DIAGNOSIS — R262 Difficulty in walking, not elsewhere classified: Secondary | ICD-10-CM

## 2016-11-14 NOTE — Therapy (Signed)
[place] MAIN [facility] SERVICES [address] [place], [place], [address] Phone: [phone]   Fax:  [phone]  Physical Therapy Treatment  Patient Details  Name: Angela Rowe MRN: [id number] Date of Birth: February 14, 1946 Referring Provider: Dr. Dyke Maes  Encounter Date: 11/14/2016      PT End of Session - 11/14/16 1019    Visit Number 12   Number of Visits 17   Date for PT Re-Evaluation 11/21/16   Authorization Type gcode 2   Authorization Time Period 10   PT Start Time 1016   PT Stop Time 1100   PT Time Calculation (min) 44 min   Equipment Utilized During Treatment Gait belt   Activity Tolerance Patient tolerated treatment well;No increased pain   Behavior During Therapy WFL for tasks assessed/performed      Past Medical History:  Diagnosis Date  . Hypertension    somewhat controlled; taking meds;   . Stroke ([place])    08/07/16    History reviewed. No pertinent surgical history.  There were no vitals filed for this visit.      Subjective Assessment - 11/14/16 1023    Subjective Patient reports doing well; She denies any significant changes; Reports mild soreness from weather changes;    Pertinent History personal factors affecting rehab: lives alone, HTN (somewhat controlled), increased risk for falls;    How long can you sit comfortably? NA   How long can you stand comfortably? will have low back pain after standing for 10-15 min;    How long can you walk comfortably? 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 Yes   Pain Score 2    Pain Location Leg   Pain Orientation Right;Left   Pain Descriptors / Indicators Sore;Aching   Pain Type Acute pain   Pain Onset Today   Pain Frequency Intermittent           TREATMENT: Warm up  on Nustep BUE/BLE level 2 x4 min (unbilled);  HOIST hamstring curl, BLE plate #5 6L84 with cues to increase knee flexion for better hamstring strengthening;  Leg press, BLE plate 120# 5X64 with cues to avoid excessive knee extension (avoid locking knees) for better quad control; Leg press, BLE plate 120# heel raises 2x12 with cues for positioning for better strengthening;   Standing hamstring stretch on step 15 sec hold x2 reps, x2 sets bilaterally;  Sit<>Stand from chair with 3# bar overhead lift x15 with cues for forward lean for better sit to stand ability;   Standing on airex: Modified tandem stance, balloon taps x3 min with good balance and good trunk control; Alternate toe taps on 4 inch step x15 unsupported, supervision demonstrating good weight shift and foot clearance; Standing one foot on airex, one foot on 4 inch step: BUE ball toss x10 with cues for equal weight shift, demonstrating good stance control, supervision;  SLS on firm surface with ball toss x10each foot with cues for throwing overhand for better toss and to keep legs close together for better stance control; SLS on airex pad, 10 sec hold each LE;  Resisted walking, 12.5# forward/backward, side/side (4 way) on red mat for increased challenge in dynamic balance on uneven surface x3 laps each, CGA for safety;  Patient required min-moderate verbal/tactile cues for correct exercise  technique.                          PT Education - 11/14/16 1024    Education provided Yes   Education Details strengthening, balance exercise, HEP reinforced;    Person(s) Educated Patient   Methods Explanation;Verbal cues   Comprehension Verbalized understanding;Returned demonstration;Verbal cues required             PT Long Term Goals - 10/24/16 1534      PT LONG TERM GOAL #1   Title Patient will be independent in home exercise program to improve strength/mobility for better functional  independence with ADLs.   Time 4   Period Weeks   Status On-going     PT LONG TERM GOAL #2   Title Patient will increase Berg Balance score by > 6 points to demonstrate decreased fall risk during functional activities.   Time 4   Period Weeks   Status Achieved     PT LONG TERM GOAL #3   Title  Patient will be independent with ascend/descend 12 steps using single UE rail in step over step pattern without LOB for increased safety in the home.    Time 4   Period Weeks   Status Partially Met     PT LONG TERM GOAL #4   Title Patient will increase BLE gross strength to 4+/5 as to improve functional strength for independent gait, increased standing tolerance and increased ADL ability.   Time 4   Period Weeks   Status Achieved     PT LONG TERM GOAL #5   Title Patient will ambulate >1200 feet on 6 min walk test to demonstrate better gait endurance and better community ambulation for return to PLOF.    Time 4   Period Weeks   Status New               Plan - 11/14/16 1024    Clinical Impression Statement Patient instructed in advanced LE strengthening; She responded well to cues with good LE control; Patient does report mild fatigue with advanced activity; She requires CGA to close supervision with advanced balance exercise. She would benefit from additional skilled PT intervention to improve balance/gait safety and return to PLOF.    Clinical Impairments Affecting Rehab Potential positive: good PLOF, negative: fall risk, lives alone, co-morbidities; Patient's clinical presentation is stable as she has made improvements and is now mod I with self care ADLs;    PT Frequency 2x / week   PT Duration 4 weeks   PT Treatment/Interventions ADLs/Self Care Home Management;Cryotherapy;Moist Heat;Neuromuscular re-education;Balance training;Therapeutic exercise;Therapeutic activities;Functional mobility training;Stair training;Gait training;Patient/family education   PT Next Visit Plan advance  HEP, strengthening/balance   PT Home Exercise Plan continue as given;    Consulted and Agree with Plan of Care Patient      Patient will benefit from skilled therapeutic intervention in order to improve the following deficits and impairments:  Decreased endurance, Decreased activity tolerance, Decreased strength, Pain, Difficulty walking, Decreased mobility, Decreased balance, Postural dysfunction, Decreased safety awareness  Visit Diagnosis: Unsteadiness on feet  Difficulty in walking, not elsewhere classified  Muscle weakness (generalized)     Problem List There are no active problems to display for this patient.   Cirilo Canner PT, DPT 11/14/2016, 11:04 AM  [place] MAIN [facility] SERVICES [address] [place], [place], [address] Phone: [phone]   Fax:  [phone]  Name: ADEANA GRILLIOT MRN: [id number]  Date of Birth: [birth year]

## 2016-11-16 ENCOUNTER — Encounter: Payer: Self-pay | Admitting: Physical Therapy

## 2016-11-16 ENCOUNTER — Ambulatory Visit: Payer: Medicare Other | Admitting: Physical Therapy

## 2016-11-16 DIAGNOSIS — R2681 Unsteadiness on feet: Secondary | ICD-10-CM | POA: Diagnosis not present

## 2016-11-16 DIAGNOSIS — M6281 Muscle weakness (generalized): Secondary | ICD-10-CM

## 2016-11-16 DIAGNOSIS — R262 Difficulty in walking, not elsewhere classified: Secondary | ICD-10-CM

## 2016-11-16 NOTE — Therapy (Signed)
[place] MAIN [facility] SERVICES [address] [place], [place], [address] Phone: [phone]   Fax:  [phone]  Physical Therapy Treatment/Discharge Summary  Patient Details  Name: Angela Rowe MRN: [id number] Date of Birth: 1945-10-06 Referring Provider: Dr. Dyke Maes  Encounter Date: 11/16/2016      PT End of Session - 11/16/16 1111    Visit Number 13   Number of Visits 17   Date for PT Re-Evaluation 11/21/16   Authorization Type gcode 3   Authorization Time Period 10   PT Start Time 1102   PT Stop Time 1145   PT Time Calculation (min) 43 min   Equipment Utilized During Treatment Gait belt   Activity Tolerance Patient tolerated treatment well;No increased pain   Behavior During Therapy WFL for tasks assessed/performed      Past Medical History:  Diagnosis Date  . Hypertension    somewhat controlled; taking meds;   . Stroke ([place])    08/07/16    History reviewed. No pertinent surgical history.  There were no vitals filed for this visit.      Subjective Assessment - 11/16/16 1110    Subjective Patient reports doing well; no new changes; She reports compliance with HEP;    Pertinent History personal factors affecting rehab: lives alone, HTN (somewhat controlled), increased risk for falls;    How long can you sit comfortably? NA   How long can you stand comfortably? will have low back pain after standing for 10-15 min;    How long can you walk comfortably? 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 No/denies   Pain Onset Today            [facility] PT Assessment - 11/16/16 0001      6 Minute Walk- Baseline   BP (mmHg) 127/56   HR (bpm) 91   02 Sat (%RA) 98 %     6 Minute walk- Post Test   BP (mmHg) 160/55   HR (bpm) 110   02  Sat (%RA) 99 %     6 minute walk test results    Aerobic Endurance Distance Walked 1120   Endurance additional comments without AD (community ambuator, improved from initial eval on 09/26/16 which was 1005 feet, less than age group norms of 1500 feet)     Berg Balance Test   Sit to Stand Able to stand without using hands and stabilize independently   Standing Unsupported Able to stand safely 2 minutes   Sitting with Back Unsupported but Feet Supported on Floor or Stool Able to sit safely and securely 2 minutes   Stand to Sit Sits safely with minimal use of hands   Transfers Able to transfer safely, minor use of hands   Standing Unsupported with Eyes Closed Able to stand 10 seconds safely   Standing Ubsupported with Feet Together Able to place feet together independently and stand 1 minute safely   From Standing, Reach Forward with Outstretched Arm Can reach confidently >25 cm (10")   From Standing Position, Pick up Object from Floor Able to pick up shoe safely and easily   From Standing Position, Turn to Look Behind Over each Shoulder Looks behind from both sides and weight shifts well   Turn 360 Degrees Able to turn  360 degrees safely in 4 seconds or less   Standing Unsupported, Alternately Place Feet on Step/Stool Able to stand independently and safely and complete 8 steps in 20 seconds   Standing Unsupported, One Foot in Front Able to plae foot ahead of the other independently and hold 30 seconds   Standing on One Leg Able to lift leg independently and hold equal to or more than 3 seconds   Total Score 53   Berg comment: <50% risk for falls     TREATMENT: Warm up on Nustep BUE/BLE level 2 x5 min (unbilled);  Instructed patient in 6 min walk and Berg Balance assessment to assess progress towards goals; Patient able to negotiate steps reciprocally with 1 rail assist demonstrating good balance and control;   Educated patient on safe equipment to use at the gym including: Warm up on  Nustep, cross trainer, or recumbent bike Gait on treadmill 1.0-1.5 mph x2 min with cues for hand placement and how to operate treadmill;  Educated patient in leg press and hamstring curl that she has been doing;  Also instructed patient in low row and biceps/tricep strengthening: Low row- plate #1 W10 with cues for positioning to improve upper back/posture strength;  Bicep curl BUE plate #2 X32 with cues for upper back posture and UE positioning to isolate bicep strengthening; Tricep press plate #3 T55 with cues for positioning to improve tricep strengthening;  Patient tolerated all exercise well; She is appropriate for discharge from PT and transition to fitness center for continued strengthening;                         PT Education - 11/16/16 1110    Education provided Yes   Education Details progress towards goals, recommendations;    Person(s) Educated Patient   Methods Explanation;Verbal cues   Comprehension Verbalized understanding;Returned demonstration;Verbal cues required             PT Long Term Goals - 11/16/16 1123      PT LONG TERM GOAL #1   Title Patient will be independent in home exercise program to improve strength/mobility for better functional independence with ADLs.   Time 4   Period Weeks   Status Achieved     PT LONG TERM GOAL #2   Title Patient will increase Berg Balance score by > 6 points to demonstrate decreased fall risk during functional activities.   Time 4   Period Weeks   Status Achieved     PT LONG TERM GOAL #3   Title  Patient will be independent with ascend/descend 12 steps using single UE rail in step over step pattern without LOB for increased safety in the home.    Time 4   Period Weeks   Status Achieved     PT LONG TERM GOAL #4   Title Patient will increase BLE gross strength to 4+/5 as to improve functional strength for independent gait, increased standing tolerance and increased ADL ability.   Time 4    Period Weeks   Status Achieved     PT LONG TERM GOAL #5   Title Patient will ambulate >1200 feet on 6 min walk test to demonstrate better gait endurance and better community ambulation for return to PLOF.    Time 4   Period Weeks   Status Partially Met               Plan - 11/16/16 1239    Clinical Impression Statement Patient  instructed in gait tasks to assess progress towards goals. Patient ambulates at community ambulator speed. 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 Patient demonstrates improved balance and strength; She has met most goals. Recommend discharge from PT and transition to fitness center for continued exercise. Patient is agreeable.    Clinical Impairments Affecting Rehab Potential positive: good PLOF, negative: fall risk, lives alone, co-morbidities; Patient's clinical presentation is stable as she has made improvements and is now mod I with self care ADLs;    PT Frequency 2x / week   PT Duration 4 weeks   PT Treatment/Interventions ADLs/Self Care Home Management;Cryotherapy;Moist Heat;Neuromuscular re-education;Balance training;Therapeutic exercise;Therapeutic activities;Functional mobility training;Stair training;Gait training;Patient/family education   PT Next Visit Plan advance HEP, strengthening/balance   PT Home Exercise Plan continue as given;    Consulted and Agree with Plan of Care Patient      Patient will benefit from skilled therapeutic intervention in order to improve the following deficits and impairments:  Decreased endurance, Decreased activity tolerance, Decreased strength, Pain, Difficulty walking, Decreased mobility, Decreased balance, Postural dysfunction, Decreased safety awareness  Visit Diagnosis: Unsteadiness on feet  Difficulty in walking, not elsewhere classified  Muscle weakness (generalized)       G-Codes - 11-28-2016 1240    Functional Assessment Tool Used (Outpatient Only)  gait ability, clinical judgement,    Functional Limitation Mobility: Walking and moving around   Mobility: Walking and Moving Around Goal Status [phone]) At least 1 percent but less than 20 percent impaired, limited or restricted   Mobility: Walking and Moving Around Discharge Status [phone]) At least 1 percent but less than 20 percent impaired, limited or restricted      Problem List There are no active problems to display for this patient.   Trotter,Margaret PT, DPT 28-Nov-2016, 12:42 PM  [place] MAIN [facility] SERVICES [address] [place], [place], [address] Phone: [phone]   Fax:  [phone]  Name: Angela Rowe MRN: [id number] Date of Birth: 1946-02-01
